# Patient Record
Sex: Male | Born: 1935
Health system: Southern US, Community
[De-identification: ages and names within clinical notes are randomized; demographics above are authoritative.]

## PROBLEM LIST (undated history)

## (undated) DIAGNOSIS — N189 Chronic kidney disease, unspecified: Secondary | ICD-10-CM

## (undated) DIAGNOSIS — M1990 Unspecified osteoarthritis, unspecified site: Secondary | ICD-10-CM

## (undated) DIAGNOSIS — N200 Calculus of kidney: Secondary | ICD-10-CM

## (undated) DIAGNOSIS — I10 Essential (primary) hypertension: Secondary | ICD-10-CM

## (undated) HISTORY — PX: JOINT REPLACEMENT: SHX530

## (undated) HISTORY — PX: TOTAL KNEE ARTHROPLASTY: SHX125

## (undated) HISTORY — PX: TONSILLECTOMY: SUR1361

## (undated) HISTORY — PX: TOTAL HIP ARTHROPLASTY: SHX124

## (undated) HISTORY — PX: ROTATOR CUFF REPAIR: SHX139

---

## 1997-10-30 ENCOUNTER — Encounter: Admission: RE | Admit: 1997-10-30 | Discharge: 1998-01-28 | Payer: Self-pay | Admitting: Orthopaedic Surgery

## 1999-08-09 ENCOUNTER — Encounter: Payer: Self-pay | Admitting: Orthopaedic Surgery

## 1999-08-12 ENCOUNTER — Inpatient Hospital Stay (HOSPITAL_COMMUNITY): Admission: RE | Admit: 1999-08-12 | Discharge: 1999-08-16 | Payer: Self-pay | Admitting: Orthopaedic Surgery

## 1999-08-30 ENCOUNTER — Encounter: Admission: RE | Admit: 1999-08-30 | Discharge: 1999-09-29 | Payer: Self-pay | Admitting: Orthopaedic Surgery

## 2001-04-03 ENCOUNTER — Encounter: Admission: RE | Admit: 2001-04-03 | Discharge: 2001-04-16 | Payer: Self-pay | Admitting: Orthopedic Surgery

## 2001-07-30 ENCOUNTER — Encounter: Payer: Self-pay | Admitting: Orthopaedic Surgery

## 2001-08-02 ENCOUNTER — Inpatient Hospital Stay (HOSPITAL_COMMUNITY): Admission: RE | Admit: 2001-08-02 | Discharge: 2001-08-06 | Payer: Self-pay | Admitting: Orthopaedic Surgery

## 2002-08-29 ENCOUNTER — Encounter: Payer: Self-pay | Admitting: Orthopedic Surgery

## 2002-08-29 ENCOUNTER — Inpatient Hospital Stay (HOSPITAL_COMMUNITY): Admission: EM | Admit: 2002-08-29 | Discharge: 2002-08-30 | Payer: Self-pay

## 2002-08-29 ENCOUNTER — Encounter: Payer: Self-pay | Admitting: Orthopaedic Surgery

## 2002-09-01 ENCOUNTER — Encounter: Payer: Self-pay | Admitting: Emergency Medicine

## 2002-09-01 ENCOUNTER — Inpatient Hospital Stay (HOSPITAL_COMMUNITY): Admission: EM | Admit: 2002-09-01 | Discharge: 2002-09-02 | Payer: Self-pay | Admitting: Emergency Medicine

## 2002-09-01 ENCOUNTER — Encounter: Payer: Self-pay | Admitting: Orthopedic Surgery

## 2003-12-31 ENCOUNTER — Observation Stay (HOSPITAL_COMMUNITY): Admission: EM | Admit: 2003-12-31 | Discharge: 2004-01-02 | Payer: Self-pay

## 2004-03-29 ENCOUNTER — Inpatient Hospital Stay (HOSPITAL_COMMUNITY): Admission: EM | Admit: 2004-03-29 | Discharge: 2004-03-30 | Payer: Self-pay | Admitting: Emergency Medicine

## 2004-04-23 ENCOUNTER — Emergency Department (HOSPITAL_COMMUNITY): Admission: EM | Admit: 2004-04-23 | Discharge: 2004-04-23 | Payer: Self-pay | Admitting: Emergency Medicine

## 2004-06-29 ENCOUNTER — Ambulatory Visit (HOSPITAL_COMMUNITY): Admission: RE | Admit: 2004-06-29 | Discharge: 2004-06-29 | Payer: Self-pay | Admitting: *Deleted

## 2008-10-01 ENCOUNTER — Encounter: Admission: RE | Admit: 2008-10-01 | Discharge: 2008-10-01 | Payer: Self-pay | Admitting: Internal Medicine

## 2009-08-05 ENCOUNTER — Encounter: Admission: RE | Admit: 2009-08-05 | Discharge: 2009-08-05 | Payer: Self-pay | Admitting: Internal Medicine

## 2010-07-19 ENCOUNTER — Emergency Department (HOSPITAL_COMMUNITY)
Admission: EM | Admit: 2010-07-19 | Discharge: 2010-07-19 | Payer: Self-pay | Source: Home / Self Care | Admitting: Emergency Medicine

## 2010-08-08 ENCOUNTER — Encounter
Admission: RE | Admit: 2010-08-08 | Discharge: 2010-08-08 | Payer: Self-pay | Source: Home / Self Care | Attending: Nephrology | Admitting: Nephrology

## 2010-08-22 ENCOUNTER — Encounter: Payer: Self-pay | Admitting: Internal Medicine

## 2010-09-01 ENCOUNTER — Encounter: Payer: Self-pay | Admitting: Nephrology

## 2010-10-11 LAB — POCT I-STAT, CHEM 8
BUN: 38 mg/dL — ABNORMAL HIGH (ref 6–23)
Calcium, Ion: 1.31 mmol/L (ref 1.12–1.32)
Chloride: 108 mEq/L (ref 96–112)
Creatinine, Ser: 1.5 mg/dL (ref 0.4–1.5)
Glucose, Bld: 112 mg/dL — ABNORMAL HIGH (ref 70–99)
HCT: 33 % — ABNORMAL LOW (ref 39.0–52.0)
Hemoglobin: 11.2 g/dL — ABNORMAL LOW (ref 13.0–17.0)
Potassium: 4.2 mEq/L (ref 3.5–5.1)
Sodium: 143 mEq/L (ref 135–145)
TCO2: 26 mmol/L (ref 0–100)

## 2010-10-11 LAB — CBC
HCT: 34.1 % — ABNORMAL LOW (ref 39.0–52.0)
Hemoglobin: 11.5 g/dL — ABNORMAL LOW (ref 13.0–17.0)
MCH: 32.3 pg (ref 26.0–34.0)
MCHC: 33.7 g/dL (ref 30.0–36.0)
MCV: 95.8 fL (ref 78.0–100.0)
Platelets: 182 10*3/uL (ref 150–400)
RBC: 3.56 MIL/uL — ABNORMAL LOW (ref 4.22–5.81)
RDW: 14.4 % (ref 11.5–15.5)
WBC: 7.9 10*3/uL (ref 4.0–10.5)

## 2010-10-11 LAB — DIFFERENTIAL
Basophils Absolute: 0 10*3/uL (ref 0.0–0.1)
Basophils Relative: 0 % (ref 0–1)
Eosinophils Absolute: 0.4 10*3/uL (ref 0.0–0.7)
Eosinophils Relative: 6 % — ABNORMAL HIGH (ref 0–5)
Lymphocytes Relative: 12 % (ref 12–46)
Lymphs Abs: 0.9 10*3/uL (ref 0.7–4.0)
Monocytes Absolute: 0.6 10*3/uL (ref 0.1–1.0)
Monocytes Relative: 8 % (ref 3–12)
Neutro Abs: 5.9 10*3/uL (ref 1.7–7.7)
Neutrophils Relative %: 75 % (ref 43–77)

## 2010-12-17 NOTE — Cardiovascular Report (Signed)
NAMEWADDELL, SCHLESSER NO.:  [id number]   MEDICAL RECORD NO.:  [id number]          PATIENT TYPE:  AMB   LOCATION:  ENDO                         FACILITY:  [facility]   PHYSICIAN:  Octavia Heir, MD  DATE OF BIRTH:  Feb 26, 1930   DATE OF PROCEDURE:  06/29/2004  DATE OF DISCHARGE:                              CARDIAC CATHETERIZATION   PROCEDURES PERFORMED:  Transesophageal echocardiogram.   ATTENDING:  Octavia Heir, M.D.   COMPLICATIONS:  None.   INDICATIONS:  Mr. Kehres is a 77 year old male patient of Dr. Tamera Punt,  Dr. Quay Burow recently noted to have a probable CVA.  He underwent 2-D  echocardiogram with a suggestion of a possible right atrial mass.  He is now  referred for transesophageal echocardiogram to rule out possibility of right  atrial mass.   DESCRIPTION OF OPERATION:  After giving informed written consent, patient  brought to the endoscopy suite in a fasting state.  The patient underwent  successful and uncomplicated transesophageal echocardiogram.   The left ventricle appeared to be mildly thickened with normal LV systolic  function, estimated EF approximately 50-55%.   Mildly thickened aortic valve leaflets with no evidence of significant  stenosis and trivial aortic regurgitation.   Mildly thickened mitral valve leaflets with mild prolapse of the posterior  mitral valve leaflet.  There is noted to be mild mitral regurgitation.   Structurally normal tricuspid valve, mild tricuspid regurgitation.   Normal pulmonic valve with mild pulmonic regurgitation.   No evidence of intracardiac mass or thrombus noted.  There does not appear  to be any extrinsic compression of the RA present.   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  This represents a very small  patent foramen ovale.   Normally ascending thoracic aorta.   CONCLUSION:  Successful  transesophageal echocardiogram with findings noted  above.  There is no evidence of intracardiac mass or extracardiac  compression visible.      Robe   RHM/MEDQ  D:  06/29/2004  T:  06/29/2004  Job:  [id number]   cc:   Quay Burow, M.D.  Fax: [phone]   Burnard Bunting, M.D.  [address]  [place]  [place] [address]  Fax: [phone]

## 2010-12-17 NOTE — Discharge Summary (Signed)
   NAME:  Nathaniel [name], Nathaniel [name]                         ACCOUNT NO.:  [id number]   MEDICAL RECORD NO.:  [id number]                   PATIENT TYPE:  INP   LOCATION:  5040                                 FACILITY:  [facility]   PHYSICIAN:  Lauretta Grill, M.D.                 DATE OF BIRTH:  04-26-1930   DATE OF ADMISSION:  09/01/2002  DATE OF DISCHARGE:  09/02/2002                                 DISCHARGE SUMMARY   ADMISSION DIAGNOSIS:  Dislocated left total hip arthroplasty.   DISCHARGE DIAGNOSIS:  Dislocated left total hip arthroplasty, status post  reduction.   HOSPITAL COURSE:  The patient was admitted through the emergency department  on September 01, 2002, with a dislocated left total hip after a fall. He had  apparently dislocated this on several occasions in the past. The plan was  made to simply closed reduce him at this time and he may need a revision at  some point _____ liner.   On September 01, 2002, he was taken to the operating room and placed under  general anesthesia and the hip was closed reduced and confirmed in position  by x-ray. There was no estimated blood loss. He was taken to the recovery  room in stable condition.   On postoperative day #1, September 02, 2002, he was doing well. We were  consulting physical therapy to start him in proper gait and bracing to avoid  repeat of dislocation. They evaluated him on hip precautions and pronounced  him ready for discharge with home health physical therapy and occupational  therapy followup. He was discharged at that time.   DISCHARGE MEDICATIONS:  He already had pain medications. He was instructed  to take them as tolerated.   DISCHARGE INSTRUCTIONS:  His activity was weight bearing as tolerated using  the walker or crutches. He was instructed to keep the knee immobilizer on at  all times. His diet was without restrictions. Wound care was not applicable.  Special instructions were to call  for any numbness or tingling distally,  worsening swelling or bruising, fever greater than 101, chills, worsening  cough or shortness of breath. He was instructed to follow up with Dr.  Rhona Raider in 2 weeks.   CONDITION ON DISCHARGE:  Stable.     Thomasenia Bottoms, P.A.-C.                       Lauretta Grill, M.D.    AC/MEDQ  D:  10/22/2002  T:  10/23/2002  Job:  [id number]

## 2010-12-17 NOTE — Op Note (Signed)
[place]. [facility]  Patient:    Nathaniel [name]                       MRN: [id number] Proc. Date: 08/12/99 Adm. Date:  [id number] Attending:  Melrose Nakayama                           Operative Report  PREOPERATIVE DIAGNOSIS:  Left knee degenerative arthritis.  POSTOPERATIVE DIAGNOSIS:  Left knee degenerative arthritis.  OPERATION:  Left total knee replacement.  SURGEON:  Monico Blitz. Rhona Raider, M.D.  ASSESSMENT:  Nathaniel [name], P.A.  ANESTHESIA:  Epidural and general.  INDICATIONS FOR PROCEDURE:  The patient is a 77 year old man with a many-year history of painful arthritis, both knees.  He is 2 years out from a successful right total knee replacement.  At this point elected to have his left knee replaced.  He had failed conservative measures of injections and anti-inflammatories.  He was given informed preoperative consent, discussing possible complications of reaction to anesthesia, infection, DVT, PE, and death.  DESCRIPTION OF PROCEDURE:  The patient was taken to the operating room where epidural anesthetic was applied.  He was positioned supine and prepped and draped in a normal sterile fashion.  After administration of _______ IV antibiotics, the left leg was elevated, exsanguinated, and a tourniquet inflated about the thigh. All appropriate anti-infective measures were used including the aforementioned V antibiotics, Betadine-impregnated drape, and closed-hooded exhaust systems for ach member of the surgical team.  A longitudinal incision was made over the anterior aspect of the knee, was suctioned down the extensor mechanism.  The patient seemed to feel this at least partially and as a result a general anesthetic was applied. We then performed a medial parapatellar arthrotomy and flipped thee kneecap and  flexed the knee.  He had no residual ACL and several large, bony loose bodies. He had end-stage degeneration of all three  compartments.  He had excellent bone quality.  There were some residual meniscal tissues which were removed.  Most of the fat pad was sacrificed.  A cut was made on the tibia with an extramedullary  guide with a 10 degree slope.  The femur sized to a large plus component and the cutting guide was applied.  Anterior and posterior cuts were made to create a flexion gap of 10 mm judged off the tibial cut.  The Chamfer guide was then applied and appropriate Chamfer cuts were made to fit a large plus femoral component. he tibia sized to a large plus-plus component and the appropriate guide was applied and used.  A 12.5 mm spacer was eventually found to be the best at balancing the knee in both flexion and extension.  This required a 2 mm extra cut on the superior aspect of the tibia.  Attention was turned to the patella which was flipped.  About 10 mm of bone was  removed with an appropriate guide.  The drill holes were then made with another  guide.  All trial components were removed, followed by thorough irrigation with  pulsatile lavage.  Cement was mixed including antibiotic.  This was then pressurized, sent to all three bony surfaces, followed by placement of the large plus-plus tibial component, large plus femoral component, and large plus patellar component, all from the LCS Depuy system.  A 12.5 mm deep dish spacer was placed. the knee was stable in full extension to varus and  valgus stress and flexed past 90 without difficulty.  The patella tracked well and no lateral release was required. Pressure was held on the components until the cement had hardened and then the tourniquet was released.  A small amount of bleeding was easily controlled with  Bovie cautery.  The wound was again irrigated followed by reapproximation of the extensor mechanism with interrupted sutures of #1 Vicryl.  Once that had been completed, the knee again flexed past 90 and the repair was  stable. Subcutaneous tissues reapproximated in two layers in 2-0 undyed Vicryl in interrupted fashion, followed by skin closure with staples.  Adaptic was placed on the wound followed by dry gauze and a loose ACE wrap.  Estimated blood loss and intraoperative fluids can be obtained from anesthesia records, as again accurate tourniquet time which was less than an hour.  DISPOSITION:  The patient was extubated in the operating room and taken to the recovery room in stable condition.  PLAN:  Plans were for him to be admitted to the orthopedic surgery service for appropriate postoperative care to include perioperative antibiotics, epidural if possible for postoperative pain control, and Coumadin for DVT prophylaxis which was actually started last night. DD:  08/12/99 TD:  08/12/99 Job: [id number]

## 2010-12-17 NOTE — Op Note (Signed)
[place]. [facility]  Patient:    Nathaniel [name], Nathaniel [name] Visit Number: [id number] MRN: [id number]          Service Type: SUR Location: [place] Attending Physician:  Melrose Nakayama Dictated by:   Monico Blitz. Rhona Raider, M.D. Proc. Date: 08/02/01 Admit Date:  08/02/2001                             Operative Report  PREOPERATIVE DIAGNOSIS:  Left hip degenerative arthritis.  POSTOPERATIVE DIAGNOSIS:  Left hip degenerative arthritis.  OPERATION PERFORMED:  Left hip replacement total.  ANESTHESIA:  General.  ATTENDING SURGEON:  Monico Blitz. Rhona Raider, M.D.  ASSISTANT:  Roselee Nova, P.A.  INDICATIONS FOR PROCEDURE:  The patient is a 77 year old male with a long history of degenerative arthritis of the left hip.  He has persisted with difficulty in activity restriction due to his arthritis.  This has been refractory to oral anti-inflammatory and activity restriction.  He was offered a hip replacement operation.  The procedure was discussed with the patient and informed operative consent was obtained after discussion of possible complications of reaction to anesthesia, infection, deep vein thrombosis, pulmonary embolus, dislocation, and death.  DESCRIPTION OF PROCEDURE:  The patient was taken to an operating suite where general anesthetic was applied without difficulty.  He was then positioned in lateral decubitus position with the left hip up.  Hip positioners were used and all bony prominences were appropriately padded including the placement of axillary roll.  The left hip was then prepped and draped in normal sterile fashion.  After the administration of preop intravenous antibiotics the posterior approach was taken to the left hip.  All appropriate anti-infective measures were used including Betadine impregnated drape, preoperative intravenous antibiotics, and closed hooded exhaust systems for each member of the surgical team.  He had a very thick adipose  layer and eventually the fascia lata and gluteus maximus were exposed.  These were incised longitudinally to expose the short external rotators of the hip which were tagged and reflected.  The posterior capsule was excised.  The hip was dislocated.  A femoral neck cut was made just above the lesser trochanter and a severely degenerative femoral head was removed.  The acetabulum was exposed and the labrum was excised.  He had good bone quality.  This was reamed up to size 57 and a 58 mm Pinnacle Depuy porous coated cup was placed.  A single apical hole was placed followed by placement of a 10 degree 32 mm liner. Attention was then turned toward the femur.  This was reamed up to about 12 or 13 mm followed by sequential broaching up to size 4.  A trial reduction was done with +1 and +5 neck, high offset.  These both seemed to fit well but the +5 was slightly more stable.  Trial components were removed followed by irrigation of the canal.  A distal cement spacer was placed followed by pressurization of cement in the canal including antibiotic.  The #4 high offset Luster Depuy stem was then placed in appropriate anteversion.  Pressure was held on the component until the cement had hardened and all excess was trimmed.  The +5 ball 32 mm was then placed on a dry stem and the hip was again reduced.  The hip was very stable in extension with external rotation and flexion with internal rotation.  Leg lengths were felt to be equal.  The wound was  thoroughly irrigated followed by reapproximation of short external rotators to the greater trochanter with nonabsorbable suture.  The fascial layer was then closed with interrupted #1 Vicryl followed by subcutaneous reapproximation in two layers and skin closure with staples.  Adaptic was applied to the wound followed by dry gauze and tape.  Estimated blood loss and intraoperative fluids can be obtained from Anesthesia records.  DISPOSITION:  The patient was  extubated in the operating room and taken to the recovery room in stable condition.  Plans were for him to be admitted for appropriate postoperative care to include perioperative antibiotics and Coumadin for deep vein thrombosis prophylaxis. Dictated by:   Monico Blitz Rhona Raider, M.D. Attending Physician:  Melrose Nakayama DD:  08/02/01 TD:  08/02/01 Job: [id number]

## 2010-12-17 NOTE — Discharge Summary (Signed)
[place]. [facility]  Patient:    Nathaniel [name]                       MRN: [id number] Adm. Date:  [id number] Disc. Date: [id number] Attending:  Melrose Nakayama Dictator:   [name], P.A.                           Discharge Summary  ADMISSION DIAGNOSES: 1. End-stage bone-on-bone degenerative joint disease, left knee. 2. Hypertension. 3. History of arthritis.  DISCHARGE DIAGNOSES: 1. End-stage bone-on-bone degenerative joint disease, left knee. 2. Hypertension. 3. History of arthritis. 4. Urine retention.  HISTORY OF PRESENT ILLNESS:  This 77 year old white male who is a patient well known to [place], as we had replaced his right knee some time ago for severe arthritis, and now the left knee has followed suit.  He is having pain with every step, trouble sleeping at night time.  He is on Vioxx which is not helping, and has had some cortisone injections which were short-lived.  Discussed treatment options, and he has decided to proceed with a total knee replacement, as we had done on the opposite side.  LABORATORY/X-RAY DATA:  Normal sinus rhythm on electrocardiogram.  Last testing of his hemoglobin was 10.3, hematocrit 28.7.  Pro times done serially, as he was on low-dose Coumadin protocol per the pharmacy for deep vein thrombosis prophylaxis.  HOSPITAL COURSE:  The patient was admitted postoperatively and placed on a variety of p.o. and IM analgesics for pain, low-dose Coumadin protocol per the pharmacy. He had physical therapy, to be weightbearing as tolerated.  The dressing was changed the second day postoperatively and the drain was pulled.  The wound was  noted to be benign.  He was eating.  He had some difficulty voiding.  Dr. Rana Snare was consulted, a urologist, and it was felt that he had suffered some urologic trauma from the Foley catheter.  DISPOSITION:  He progressed well and is discharged home.  CONDITION ON DISCHARGE:   Improved.  INSTRUCTIONS:  He will be on Coumadin for four weeks, regulated by the pharmacy. Home physical therapy was ordered.  He is given a prescription for pain medicine. He will be weightbearing as tolerated with crutches or a walker.  FOLLOWUP:  He will return to our office within one week for stitch or staple removal. DD:  09/07/99 TD:  09/07/99 Job: [id number]

## 2010-12-17 NOTE — Consult Note (Signed)
[place]. [facility]  Patient:    Nathaniel [name]                       MRN: [id number] Proc. Date: 08/14/99 Adm. Date:  [id number] Attending:  Melrose Nakayama Dictator:   Rana Snare, M.D. CC:         Monico Blitz. Rhona Raider, M.D.                          Consultation Report  REASON FOR CONSULTATION:  Gross hematuria and questionable voiding difficulties.  HISTORY OF PRESENT ILLNESS:  Nathaniel [name] is a 77 year old white male.  He apparently has had one kidney stone in the past, but otherwise does not have any significant urologic history.  He reports no major problems with his voiding status prior to his recent admission to [facility].  He reports no significant problems with  nocturia or frequency and has generally had a good urinary stream without gross  hematuria.  The patient was admitted for postop care, status post a total knee replacement on August 12, 1999.  The patient was started on anticoagulation for DVT prophylaxis.  A Foley catheter was left indwelling.  Apparently the patient did climb out of bed the other night, and there was some concern that he may have traumatized himself with the Foley catheter as some gross hematuria was noted. The patient also began complaining of significant amounts of urgency and dome Ditropan was ordered.  It is uncertain whether the Foley was draining well last night. t was removed this morning and apparently upon the removal there was a gush of some grossly bloody urine and also some clot per urethra.  I was contacted by the PA for the service to see if we would please evaluate this gentleman.  Since that time, he has been able to void three times with a total volume only about 50 to 75 cc and is grossly bloody with a small amount of oozing from his penis.  PAST MEDICAL HISTORY:  Otherwise unremarkable for any other significant urologic history.  He has had a right knee replaced and rotator cuff  surgery.  MEDICATIONS:  He has been on some anti-inflammatories.  ALLERGIES:  He has no drug allergies.  HABITS:  He is not a smoker or drinker.  REVIEW OF SYSTEMS:  His review of systems again was unremarkable for any gross hematuria, dysuria, or major voiding symptoms preoperatively.  PHYSICAL EXAMINATION:  GENERAL:  He is a well-developed, well-nourished male.  EXTREMITIES:  His left leg is immobilized.  ABDOMEN:  Soft.  There is a question of some bladder distension.  GU:  There is a small amount of blood oozing from the urethral meatus which is  otherwise normal, and the testes, scrotum, and adnexal structures are unremarkable. I did not perform prostate examination at this time.  PROCEDURE:  We elected to go ahead and place an 86 French Foley catheter. Approximately 650 cc was obtained and the urine was a very light pink in color.  Irrigation revealed no clots.  ASSESSMENT:  History of gross hematuria with subsequent urinary retention.  Is suspect the patient did indeed suffer some prostatic trauma from dislodging of he prostate balloon into his prostatic fossa.  This probably resulted in some tears which led to the bleeding and now the retention.  I think he needs to have an indwelling Foley for 24-48 hours and initiation of some Flomax.  We will try a voiding trial in 48 hours to see how he does.  We will plan additional evaluation postoperatively to be sure there is no other cause for his hematuria. DD:  08/14/99 TD:  08/14/99 Job: [id number]

## 2010-12-17 NOTE — Op Note (Signed)
NAMEEOGHAN, SALMO                         ACCOUNT NO.:  [id number]   MEDICAL RECORD NO.:  [id number]                   PATIENT TYPE:  INP   LOCATION:  0460                                 FACILITY:  [facility]   PHYSICIAN:  Monico Blitz. Rhona Raider, M.D.             DATE OF BIRTH:  1929/11/02   DATE OF PROCEDURE:  03/29/2004  DATE OF DISCHARGE:                                 OPERATIVE REPORT   PREOPERATIVE DIAGNOSES:  Dislocated left total hip replacement.   POSTOPERATIVE DIAGNOSES:  Dislocated left total hip replacement.   PROCEDURE:  Closed reduction left dislocated hip replacement.   ANESTHESIA:  General mask.   SURGEON:  Monico Blitz. Rhona Raider, M.D.   INDICATIONS FOR PROCEDURE:  The patient is a 77 year old  man several years  out from a left hip replacement. He is also about a year and a half out from  a dislocation which required a closed reduction.  Unfortunately today, he  was bending over to pick up a bolt and his hip slipped out of place. He was  seen in the emergency room and orthopedics was consulted about this problem.  He was offered a repeat closed reduction.  Informed operative consent was  obtained after a discussion of the possible complications of reaction to  anesthesia and repeat dislocation.   DESCRIPTION OF PROCEDURE:  The patient was taken to the operating suite  where general anesthetic was applied by mask and IV. He was positioned  supine. With flexion and traction, his hip reduced in a moderately difficult  fashion.  His leg lengths were subsequently equal and he had normal rotation  of the leg.  Flexing his hip up to 100 degrees, the hip did not dislocate  again.  Estimated blood loss is none. Intraoperative fluids can be obtained  from anesthesia records.   DISPOSITION:  The patient was taken to the recovery room stable. Plans for  him to be admitted to the orthopedic surgery service for appropriate postop  care to include physical therapy and bracing.  There  is the chance he may  require revision surgery though his components seem to be in excellent  position.                                               Monico Blitz Rhona Raider, M.D.    PGD/MEDQ  D:  03/29/2004  T:  03/30/2004  Job:  [id number]

## 2010-12-17 NOTE — Discharge Summary (Signed)
   Nathaniel [name], [name]                         ACCOUNT NO.:  [id number]   MEDICAL RECORD NO.:  [id number]                   PATIENT TYPE:  INP   LOCATION:  5016                                 FACILITY:  [facility]   PHYSICIAN:  Monico Blitz. Rhona Raider, M.D.             DATE OF BIRTH:  07/29/1930   DATE OF ADMISSION:  08/29/2002  DATE OF DISCHARGE:  08/30/2002                                 DISCHARGE SUMMARY   ADMITTING DIAGNOSIS:  Left hip dislocation, status post total hip  replacement.   DISCHARGE DIAGNOSIS:  Left hip dislocation, status post total hip  replacement.   OPERATION:  Closed reduction of left hip replacement.   HISTORY OF PRESENT ILLNESS:  The patient is a 77 year old white male who is  a patient well-known to our practice.  We had replaced his hip on the left  side some time ago and he had done well until he had fallen on the ice,  hitting on his left side, and then with significant pain in his left hip,  was unable to walk and was transported by EMS to the hospital and localizing  x-rays were taken and it was noted to be dislocated.   PERTINENT LABORATORY AND X-RAY FINDINGS:  X-rays showed the superior  dislocation of the femoral component of the left hip.   Hemoglobin 14.2, hematocrit 40.5, otherwise indices essentially normal.   COURSE IN THE HOSPITAL:  The patient was taken to the operating room and his  hip was replaced under general anesthesia and he was kept on the floor for  one day and was discharged home the next morning in good condition.   CONDITION ON DISCHARGE:  Improved.   FOLLOWUP:  He will stay in the knee immobilizer, will continue on pain  medications p.r.n. and return to the office in about one to two weeks for a  recheck and we will caution him about total hip precautions and cautions for  dislocation as he should try to prevent.     Roselee Nova, P.A.                    Monico Blitz Rhona Raider, M.D.    MC/MEDQ  D:  10/01/2002  T:  10/01/2002   Job:  [id number]

## 2010-12-17 NOTE — Op Note (Signed)
   NAME:  Nathaniel [name], Nathaniel [name]                         ACCOUNT NO.:  [id number]   MEDICAL RECORD NO.:  [id number]                   PATIENT TYPE:  EMS   LOCATION:  MINO                                 FACILITY:  [facility]   PHYSICIAN:  Monico Blitz. Rhona Raider, M.D.             DATE OF BIRTH:  [birth year]   DATE OF PROCEDURE:  08/29/2002  DATE OF DISCHARGE:                                 OPERATIVE REPORT   PREOPERATIVE DIAGNOSIS:  Left total hip replacement dislocation.   POSTOPERATIVE DIAGNOSIS:  Left total hip replacement dislocation.   PROCEDURE:  Left total hip replacement relocation.   SURGEON:  Monico Blitz. Rhona Raider, M.D.   ANESTHESIA:  General.   ASSISTANT:  Roselee Nova, P.A.   INDICATIONS FOR PROCEDURE:  The patient is a 77 year old man 10 years out  from a left hip replacement.  He has done very well, but unfortunately fell  on the ice today and twisted his leg in an unfavorable position.  He  suffered his first dislocation at this point.  He was seen in the emergency  room and attempt at a closed reduction was made, but was unsuccessful. He  was scheduled for this procedure under general anesthesia.  The procedure  had been discussed with the patient and informed operative consent was  obtained after discussion of possible complications of reaction to  anesthesia, infection, and loosening of components.   DESCRIPTION OF PROCEDURE:  The patient was taken to the operating room where  general anesthesia was applied without difficulty.  He was positioned supine  with his hip flexed 90 degrees, some traction was applied gently, and with  slight rotation, the hip popped into place.  Fluoroscopy was used to confirm  that the hip was reduced.  I read these views myself.  He was then gradually  awakened from his anesthetic.  Intraoperative fluids can be obtained from  the anesthesia records. He was taken to the recovery room in stable  condition with plans to stay overnight with probably  discharge home in the  morning once he can mobilize with physical therapy.                                               Monico Blitz Rhona Raider, M.D.    PGD/MEDQ  D:  08/29/2002  T:  08/29/2002  Job:  [id number]

## 2010-12-17 NOTE — Op Note (Signed)
   NAME:  Nathaniel [name], Nathaniel [name]                         ACCOUNT NO.:  [id number]   MEDICAL RECORD NO.:  [id number]                   PATIENT TYPE:  INP   LOCATION:  5040                                 FACILITY:  [facility]   PHYSICIAN:  Lauretta Grill, M.D.                 DATE OF BIRTH:  November 04, 1929   DATE OF PROCEDURE:  09/01/2002  DATE OF DISCHARGE:                                 OPERATIVE REPORT   PREOPERATIVE DIAGNOSIS:  Closed left total hip arthroplasty dislocation.   POSTOPERATIVE DIAGNOSIS:  Closed left total hip arthroplasty dislocation.   PROCEDURE:  Closed reduction of left total hip arthroplasty.   SURGEON:  Lynden Ang, M.D.   ASSISTANT:  Thomasenia Bottoms, P.A.-C.   ANESTHESIA:  General endotracheal.   CULTURES DONE:  None.   ESTIMATED BLOOD LOSS:  None.   PATHOLOGIC FINDINGS/HISTORY:  The patient is a 77 year old male treated by  Dr. Rhona Raider with a total hip arthroplasty DePuy uncemented type  approximately a year ago.  He slipped on the ice 01/29, dislocated the hip.  This was two days ago.  He had it reduced and was sent home.  Last evening  he does not remember what happened but it popped out again.  On exam today  it was shortened and externally rotated.  Hip x-rays showed the hip femoral  head to be superior.  We were able to reduce it with fairly strong pressure  under muscle relaxation but it did dislocate with internal rotation flexion  at about 90 degrees and 30 degrees.  I felt it was somewhat unstable.  We  put him in a knee immobilizer and abduction pillow.  He may come  ________.   PROCEDURE:  With adequate anesthesia obtained using endotracheal technique  the patient was placed in the supine position.  The patient had muscle  relaxation on the [place] table and the C-arm was brought in.  I then  reduced the dislocation.  At first it was perched and then I was able to get  it back in.  I then tested to get it back out and put it back in again by  adduction,  external rotation and flexion and traction.  He was placed in a  knee immobilizer and wedge type pillow and taken to the recovery room to be  admitted for routine postoperative care and evaluation by Dr. Rhona Raider  tomorrow.                                               Lauretta Grill, M.D.    VEP/MEDQ  D:  09/01/2002  T:  09/01/2002  Job:  [id number]

## 2010-12-17 NOTE — Discharge Summary (Signed)
NAME:  Nathaniel [name], Nathaniel [name]                         ACCOUNT NO.:  [id number]   MEDICAL RECORD NO.:  [id number]                   PATIENT TYPE:  OBV   LOCATION:  5730                                 FACILITY:  [facility]   PHYSICIAN:  Judeth Horn, M.D.                   DATE OF BIRTH:  08/16/1929   DATE OF ADMISSION:  DATE OF DISCHARGE:  01/02/2004                                 DISCHARGE SUMMARY   FINAL DIAGNOSES:  1. Crush injury.  2. Blunt chest wall trauma.  3. Chest wall contusion.  4. Old cerebrovascular accidents.  5. Hypertension.   HISTORY:  This is a 77 year old white male who was standing by his tractor  when he put it in gear.  It rolled forward and crushed him and gave his some  contusion to his chest wall.  He was brought to [facility]  emergency room.  Workup was performed.  No definite rib fractures were ever  seen.  He had no pneumothorax.  CT of the head and neck were negative except  for signs of arthritis.   HOSPITAL COURSE:  The patient was admitted for observation.  The patient did  well during his stay in the hospital.  Diet was advanced as tolerated.  He  was up and ambulating without difficulty.  By June 3, he was ready for  dischage.  At this point, he was doing well.  He was taking very little pain  medicines.  I have given him Vicodin one to two p.o. q.4-6h. p.r.n. for  pain, #30 of these, no refills. He was told to follow up with trauma clinic  on the 14th of June.   DISCHARGE CONDITION:  He is subsequently discharged at this time in  satisfactory and stable condition.      Billey Chang, P.A.                      Judeth Horn, M.D.    CL/MEDQ  D:  01/02/2004  T:  01/04/2004  Job:  [id number]   cc:   Judeth Horn III, M.D.  [address] [place]  [place] [address]  Fax: [phone]

## 2010-12-17 NOTE — Discharge Summary (Signed)
NAMEEDO, GRZYB                         ACCOUNT NO.:  [id number]   MEDICAL RECORD NO.:  [id number]                   PATIENT TYPE:  INP   LOCATION:  0460                                 FACILITY:  [facility]   PHYSICIAN:  Monico Blitz. Rhona Raider, M.D.             DATE OF BIRTH:  [birth year]   DATE OF ADMISSION:  03/29/2004  DATE OF DISCHARGE:  03/30/2004                                 DISCHARGE SUMMARY   ADMISSION DIAGNOSES:  1.  Left total hip replacement with dislocated hip.  2.  Hypertension.  3.  Status post bilateral total knee replacements.  4.  Status post rotator cuff repair.   DISCHARGE DIAGNOSES:  1.  Left total hip replacement with dislocated hip.  2.  Hypertension.  3.  Status post bilateral total knee replacements.  4.  Status post rotator cuff repair.   OPERATIONS:  Closed reduction in the operating room of left hip dislocation.   HISTORY:  Nathaniel [name] is a patient well known to our practice.  We have known  him for many years and done multiple joint replacements on him.  He had his  left hip replaced 1-1/2 to 2 years ago and had minimal difficulties until  yesterday when he was working, and he works as a Systems developer, was bending  over in an awkward position and his hip dislocated.  He was unable to bear  weight, was transported to [facility] Emergency Room, and a x-ray there had  a status post total hip replacement dislocation.  We had gone over the  treatment options with him, basically were either to try to relocate the hip  in the emergency room which we elected not to do versus going to the  operating room for just a quick general anesthesia and hip relocation which  is what was done.   LABORATORY DATA:  No labs were drawn.  He had a recent EKG, chest x-ray, and  lab work done at [facility] and these were faxed over to the  anesthesiologist.  His last studies were done on March 12, 2004.  White  blood cell count was 5.4, hemoglobin 14.1, hematocrit  40.9.  EKG normal  sinus rhythm.   HOSPITAL COURSE:  The patient was admitted from the emergency room and later  that afternoon taken to the operating room where general anesthesia was  given and his hip was relocated without difficulty.  On the first day  postoperatively his vital signs were stable, blood pressure 115/60,  temperature 97.  He had good neurovascular status to his left lower  extremity and right lower extremity.  Lungs were clear.  Cardiac:  S1 and  S2.  Abdomen was soft, positive bowel sounds.  Physical therapy was to get  him up this day and teach him just the weightbearing gait with assistance or  without.  Also go over, as I have gone over with him today, total hip  dislocation precautions.  His IV and O2 were discontinued.  He was  discharged home.   CONDITION ON DISCHARGE:  Improved.   FOLLOWUP:  He is on two blood pressure medications which he did not remember  the name of prior to his admission to the hospital, and from his [facility] records I do not see them listed in there as well, but he  will resume his two medications which he has at home and has refills on he  states.  I also wrote him a prescription for Vicodin one or two q.4-6h.  p.r.n. pain.  He can be weightbearing as tolerated with crutches or walker  or a cane.  He has gone over with me and with the physical therapist his hip  dislocation precautions.  We will see him back in our office in two weeks.  Call [phone] for appointment.   DIET:  Regular.   WOUND CARE:  No wound or dressing changes.     Roselee Nova, P.A.                    Monico Blitz Rhona Raider, M.D.    MC/MEDQ  D:  03/30/2004  T:  03/30/2004  Job:  [id number]

## 2010-12-17 NOTE — Discharge Summary (Signed)
[place]  Patient:    Nathaniel [name], Nathaniel [name] Visit Number: [id number] MRN: [id number]          Service Type: SUR Location: [facility] Attending Physician:  Melrose Nakayama Dictated by:   [name], P.A. Admit Date:  08/02/2001 Discharge Date: 08/06/2001                             Discharge Summary  ADMISSION DIAGNOSES: 1. End-stage left hip degenerative joint disease. 2. Hypertension.  DISCHARGE DIAGNOSES: 1. End-stage left hip degenerative joint disease. 2. Hypertension.  OPERATIONS:  Left total hip replacement.  BRIEF HISTORY:  A 77 year old white male patient well known to [place].  We had replaced his knee back in 1998 and is having pain with every step now in his left hip and some trouble sleeping at night time.  X-rays revealed end-stage bone on bone DJD and we have discussed treatment options with the patient as being total hip replacement.  PERTINENT X-RAY AND LABORATORY DATA:  Last hemoglobin 11.8, hematocrit 33.5. Serial prothrombin times were drawn as he was on low dose Coumadin protocol. Other lab indices can be found per medical records.  Chest x-ray showed some atelectasis and some scarring but no active disease noted.  HOSPITAL COURSE:  The patient was admitted postoperatively and placed on a variety of p.o. and IM analgesics for pain. IV Ancef one gram q.8h. x3 doses. Dressings were changed numerous times throughout his hospital stay.  Physical therapy was ordered for weight bearing as tolerated.  The pharmacy was also ordered for help with low dose anticoagulation Coumadin therapy and he was discharged home.  CONDITION ON DISCHARGE:  Improved.  FOLLOW-UP:  He will return to our office in about seven to ten days.  He was given a prescription for Coumadin and one for Percocet for pain.  Arrangements for home physical therapy and home prothrombin times were also arranged. Dictated by:   [name],  P.A. Attending Physician:  Melrose Nakayama DD:  09/24/01 TD:  09/24/01 Job: [id number]

## 2011-08-10 DIAGNOSIS — M545 Low back pain, unspecified: Secondary | ICD-10-CM | POA: Diagnosis not present

## 2011-08-10 DIAGNOSIS — M47817 Spondylosis without myelopathy or radiculopathy, lumbosacral region: Secondary | ICD-10-CM | POA: Diagnosis not present

## 2011-08-18 DIAGNOSIS — N390 Urinary tract infection, site not specified: Secondary | ICD-10-CM | POA: Diagnosis not present

## 2011-08-18 DIAGNOSIS — N2581 Secondary hyperparathyroidism of renal origin: Secondary | ICD-10-CM | POA: Diagnosis not present

## 2011-08-18 DIAGNOSIS — N184 Chronic kidney disease, stage 4 (severe): Secondary | ICD-10-CM | POA: Diagnosis not present

## 2011-08-18 DIAGNOSIS — I129 Hypertensive chronic kidney disease with stage 1 through stage 4 chronic kidney disease, or unspecified chronic kidney disease: Secondary | ICD-10-CM | POA: Diagnosis not present

## 2011-08-23 DIAGNOSIS — M47817 Spondylosis without myelopathy or radiculopathy, lumbosacral region: Secondary | ICD-10-CM | POA: Diagnosis not present

## 2011-09-12 DIAGNOSIS — M545 Low back pain, unspecified: Secondary | ICD-10-CM | POA: Diagnosis not present

## 2011-09-16 DIAGNOSIS — N184 Chronic kidney disease, stage 4 (severe): Secondary | ICD-10-CM | POA: Diagnosis not present

## 2011-09-16 DIAGNOSIS — E213 Hyperparathyroidism, unspecified: Secondary | ICD-10-CM | POA: Diagnosis not present

## 2011-09-20 DIAGNOSIS — M545 Low back pain, unspecified: Secondary | ICD-10-CM | POA: Diagnosis not present

## 2011-09-21 DIAGNOSIS — M545 Low back pain, unspecified: Secondary | ICD-10-CM | POA: Diagnosis not present

## 2011-09-27 DIAGNOSIS — M545 Low back pain, unspecified: Secondary | ICD-10-CM | POA: Diagnosis not present

## 2011-09-27 DIAGNOSIS — M47817 Spondylosis without myelopathy or radiculopathy, lumbosacral region: Secondary | ICD-10-CM | POA: Diagnosis not present

## 2011-09-29 DIAGNOSIS — M545 Low back pain, unspecified: Secondary | ICD-10-CM | POA: Diagnosis not present

## 2011-10-04 DIAGNOSIS — M545 Low back pain, unspecified: Secondary | ICD-10-CM | POA: Diagnosis not present

## 2011-10-04 DIAGNOSIS — M47817 Spondylosis without myelopathy or radiculopathy, lumbosacral region: Secondary | ICD-10-CM | POA: Diagnosis not present

## 2011-10-06 DIAGNOSIS — M545 Low back pain, unspecified: Secondary | ICD-10-CM | POA: Diagnosis not present

## 2011-10-06 DIAGNOSIS — M47817 Spondylosis without myelopathy or radiculopathy, lumbosacral region: Secondary | ICD-10-CM | POA: Diagnosis not present

## 2011-10-10 DIAGNOSIS — M19019 Primary osteoarthritis, unspecified shoulder: Secondary | ICD-10-CM | POA: Diagnosis not present

## 2011-10-10 DIAGNOSIS — M47817 Spondylosis without myelopathy or radiculopathy, lumbosacral region: Secondary | ICD-10-CM | POA: Diagnosis not present

## 2011-10-25 DIAGNOSIS — M47817 Spondylosis without myelopathy or radiculopathy, lumbosacral region: Secondary | ICD-10-CM | POA: Diagnosis not present

## 2011-11-03 DIAGNOSIS — M47817 Spondylosis without myelopathy or radiculopathy, lumbosacral region: Secondary | ICD-10-CM | POA: Diagnosis not present

## 2011-11-09 DIAGNOSIS — M545 Low back pain, unspecified: Secondary | ICD-10-CM | POA: Diagnosis not present

## 2011-11-09 DIAGNOSIS — M25519 Pain in unspecified shoulder: Secondary | ICD-10-CM | POA: Diagnosis not present

## 2011-11-10 DIAGNOSIS — I10 Essential (primary) hypertension: Secondary | ICD-10-CM | POA: Diagnosis not present

## 2011-11-10 DIAGNOSIS — N184 Chronic kidney disease, stage 4 (severe): Secondary | ICD-10-CM | POA: Diagnosis not present

## 2011-11-10 DIAGNOSIS — D649 Anemia, unspecified: Secondary | ICD-10-CM | POA: Diagnosis not present

## 2011-11-10 DIAGNOSIS — E213 Hyperparathyroidism, unspecified: Secondary | ICD-10-CM | POA: Diagnosis not present

## 2011-11-11 DIAGNOSIS — M47817 Spondylosis without myelopathy or radiculopathy, lumbosacral region: Secondary | ICD-10-CM | POA: Diagnosis not present

## 2011-11-25 DIAGNOSIS — N184 Chronic kidney disease, stage 4 (severe): Secondary | ICD-10-CM | POA: Diagnosis not present

## 2011-11-29 DIAGNOSIS — M47817 Spondylosis without myelopathy or radiculopathy, lumbosacral region: Secondary | ICD-10-CM | POA: Diagnosis not present

## 2011-12-18 ENCOUNTER — Emergency Department (HOSPITAL_COMMUNITY)
Admission: EM | Admit: 2011-12-18 | Discharge: 2011-12-18 | Disposition: A | Discharge status: Home / Self Care | Payer: Medicare Other | Attending: Emergency Medicine | Admitting: Emergency Medicine

## 2011-12-18 ENCOUNTER — Encounter (HOSPITAL_COMMUNITY): Payer: Self-pay | Admitting: Nurse Practitioner

## 2011-12-18 ENCOUNTER — Emergency Department (HOSPITAL_COMMUNITY): Payer: Medicare Other

## 2011-12-18 DIAGNOSIS — T84029A Dislocation of unspecified internal joint prosthesis, initial encounter: Secondary | ICD-10-CM | POA: Diagnosis not present

## 2011-12-18 DIAGNOSIS — T148XXA Other injury of unspecified body region, initial encounter: Secondary | ICD-10-CM | POA: Diagnosis not present

## 2011-12-18 DIAGNOSIS — M25559 Pain in unspecified hip: Secondary | ICD-10-CM | POA: Diagnosis not present

## 2011-12-18 DIAGNOSIS — Z96649 Presence of unspecified artificial hip joint: Secondary | ICD-10-CM | POA: Insufficient documentation

## 2011-12-18 DIAGNOSIS — M169 Osteoarthritis of hip, unspecified: Secondary | ICD-10-CM | POA: Diagnosis not present

## 2011-12-18 DIAGNOSIS — Z0389 Encounter for observation for other suspected diseases and conditions ruled out: Secondary | ICD-10-CM | POA: Diagnosis not present

## 2011-12-18 DIAGNOSIS — S73006A Unspecified dislocation of unspecified hip, initial encounter: Secondary | ICD-10-CM | POA: Diagnosis not present

## 2011-12-18 DIAGNOSIS — S79929A Unspecified injury of unspecified thigh, initial encounter: Secondary | ICD-10-CM | POA: Insufficient documentation

## 2011-12-18 DIAGNOSIS — X500XXA Overexertion from strenuous movement or load, initial encounter: Secondary | ICD-10-CM | POA: Insufficient documentation

## 2011-12-18 DIAGNOSIS — S79919A Unspecified injury of unspecified hip, initial encounter: Secondary | ICD-10-CM | POA: Diagnosis not present

## 2011-12-18 MED ORDER — HYDROMORPHONE HCL PF 1 MG/ML IJ SOLN
INTRAMUSCULAR | Status: AC
  Filled 2011-12-18: qty 1

## 2011-12-18 MED ORDER — SODIUM CHLORIDE 0.9 % IV BOLUS (SEPSIS)
1000.0000 mL | Freq: Once | INTRAVENOUS | Status: AC
  Administered 2011-12-18: 1000 mL via INTRAVENOUS

## 2011-12-18 MED ORDER — PROPOFOL 10 MG/ML IV BOLUS: 200.0000 mg | Freq: Once | INTRAVENOUS | Status: DC

## 2011-12-18 MED ORDER — PROPOFOL 10 MG/ML IV BOLUS
INTRAVENOUS | Status: AC | PRN
  Administered 2011-12-18: 50 mg via INTRAVENOUS
  Administered 2011-12-18: 100 mg via INTRAVENOUS

## 2011-12-18 MED ORDER — SODIUM CHLORIDE 0.9 % IV SOLN
INTRAVENOUS | Status: AC | PRN
  Administered 2011-12-18: 1000 mL via INTRAVENOUS

## 2011-12-18 MED ORDER — HYDROMORPHONE HCL PF 1 MG/ML IJ SOLN
1.0000 mg | Freq: Once | INTRAMUSCULAR | Status: AC
  Administered 2011-12-18: 1 mg via INTRAVENOUS

## 2011-12-18 NOTE — ED Provider Notes (Signed)
Patient felt pop in his left hip when he was in the seated position and bent over,, flexing at the waist Treated by EMS with fentanyl prior to coming here with partial pain relief Exam alert nontoxic Glasgow Coma Score 15 left lower terminates is internally rotated and shortened DP pulse 2+ Reduction of left hip joint was performed by Dr. Marcello Moores under my direct supervision while I perform procedural sedation with propofol. Patient's last ate at 3 PM today Procedural sedation Performed by: Orlie Dakin Consent: Verbal consent obtained. Risks and benefits: risks, benefits and alternatives were discussed Required items: required blood products, implants, devices, and special equipment available Patient identity confirmed I was present for the entire procedure: arm band and provided demographic data Time out: Immediately prior to procedure a "time out" was called to verify the correct patient, procedure, equipment, support staff and site/side marked as required.  Sedation type: moderate (conscious) sedation NPO time confirmed and considedered  Sedatives: PROPOFOL  Physician Time at Bedside: 5 minute  Vitals: Vital signs were monitored during sedation. Cardiac Monitor, pulse oximeter Patient tolerance: Patient tolerated the procedure well with no immediate complications. Comments: Pt with uneventful recovered. Returned to pre-procedural sedation baseline    i was present for the ebntire procedure  Orlie Dakin, MD 12/18/11 2032

## 2011-12-18 NOTE — ED Notes (Signed)
Pt was able to walk around nurses station with no pain/discomfort. EDP made aware.

## 2011-12-18 NOTE — ED Notes (Signed)
Informed pt and family of delay and that we are waiting for fluids to complete for procedure. Will continue to monitor.

## 2011-12-18 NOTE — ED Provider Notes (Signed)
History     CSN: [id number]  Arrival date & time 12/18/11  1722   First MD Initiated Contact with Patient 12/18/11 1727      Chief Complaint  Patient presents with  . Hip Injury    (Consider location/radiation/quality/duration/timing/severity/associated sxs/prior treatment) HPI Comments: Has left artificial hip x 7 years.  Felt it pop out while bending over PTA.  Can move foot.  No other injury.  Patient is a 78 y.o. male presenting with hip pain. The history is provided by the patient.  Hip Pain This is a new problem. The current episode started today. The problem occurs constantly. The problem has been unchanged. Pertinent negatives include no arthralgias, chest pain, congestion, fatigue, fever, headaches, rash or weakness. The symptoms are aggravated by nothing. He has tried nothing for the symptoms.    History reviewed. No pertinent past medical history.  Past Surgical History  Procedure Date  . Total knee arthroplasty   . Total hip arthroplasty     History reviewed. No pertinent family history.  History  Substance Use Topics  . Smoking status: Former Smoker    Quit date: 08/02/1991  . Smokeless tobacco: Current User    Types: Chew  . Alcohol Use: No      Review of Systems  Constitutional: Negative for fever, activity change and fatigue.  HENT: Negative for congestion.   Eyes: Negative for pain.  Respiratory: Negative for chest tightness, shortness of breath, wheezing and stridor.   Cardiovascular: Negative for chest pain and leg swelling.  Genitourinary: Negative for dysuria.  Musculoskeletal: Negative for arthralgias.  Skin: Negative for rash.  Neurological: Negative for weakness and headaches.  Psychiatric/Behavioral: Negative for behavioral problems.    Allergies  Review of patient's allergies indicates no known allergies.  Home Medications   Current Outpatient Rx  Name Route Sig Dispense Refill  . NAPROXEN SODIUM 220 MG PO TABS Oral Take 220 mg  by mouth daily as needed. For pain      BP 111/88  Pulse 90  Temp(Src) 98.4 F (36.9 C) (Oral)  Resp 20  Ht 6' (1.829 m)  Wt 215 lb (97.523 kg)  BMI 29.16 kg/m2  SpO2 100%  Physical Exam  Constitutional: He is oriented to person, place, and time. He appears well-developed and well-nourished. No distress.  HENT:  Head: Normocephalic and atraumatic.  Eyes: Conjunctivae and EOM are normal. Pupils are equal, round, and reactive to light. No scleral icterus.  Neck: Normal range of motion. Neck supple.  Cardiovascular: Normal rate and regular rhythm.  Exam reveals no gallop and no friction rub.   No murmur heard. Pulmonary/Chest: Effort normal and breath sounds normal. No respiratory distress. He has no wheezes. He has no rales. He exhibits no tenderness.  Abdominal: Soft. He exhibits no distension and no mass. There is no tenderness. There is no rebound and no guarding.  Musculoskeletal: Normal range of motion. He exhibits no edema and no tenderness.       L leg shortened, internally rotated.  TTP L hip.  2+ DP.  Moving all toes.  Neurological: He is alert and oriented to person, place, and time. He has normal reflexes. No cranial nerve deficit. He exhibits normal muscle tone. Coordination normal.  Skin: Skin is warm and dry. No rash noted. He is not diaphoretic. No erythema.  Psychiatric: He has a normal mood and affect. His behavior is normal. Judgment and thought content normal.    ED Course  ORTHOPEDIC INJURY TREATMENT Date/Time: 12/18/2011 8:30  PM Performed by: Dyke Brackett Authorized by: Orlie Dakin Consent: Verbal consent obtained. Written consent obtained. Risks and benefits: risks, benefits and alternatives were discussed Consent given by: patient Patient identity confirmed: arm band Time out: Immediately prior to procedure a "time out" was called to verify the correct patient, procedure, equipment, support staff and site/side marked as required. Injury location:  hip Location details: left hip Injury type: dislocation Dislocation type: posterior Spontaneous dislocation: yes Prosthesis: yes Pre-procedure neurovascular assessment: neurovascularly intact Pre-procedure distal perfusion: normal Pre-procedure neurological function: normal Pre-procedure range of motion: reduced Local anesthesia used: no Patient sedated: yes Sedation type: moderate (conscious) sedation Sedatives: propofol Sedation start date/time: 12/18/2011 8:24 PM Sedation end date/time: 12/18/2011 8:29 PM Vitals: Vital signs were monitored during sedation. Manipulation performed: yes Reduction method: traction and counter traction Reduction successful: yes X-ray confirmed reduction: yes Post-procedure neurovascular assessment: post-procedure neurovascularly intact Post-procedure distal perfusion: normal Post-procedure neurological function: normal Post-procedure range of motion: normal Patient tolerance: Patient tolerated the procedure well with no immediate complications.   (including critical care time)  Labs Reviewed - No data to display Dg Hip Complete Left  12/18/2011  *RADIOLOGY REPORT*  Clinical Data: Post reduction left hip  LEFT HIP - COMPLETE 2+ VIEW  Comparison: 12/18/2011  Findings: Interval left hip arthroplasty reduction.  The femoral component is now seated within the acetabular component.  No periprosthetic lucency identified.  No fracture identified. Advanced right hip DJD.  Advanced lower lumbar spine DJD.  IMPRESSION: Interval left hip arthroplasty reduction.  Original Report Authenticated By: Suanne Marker, M.D.   Dg Hip Complete Left  12/18/2011  *RADIOLOGY REPORT*  Clinical Data: Left proximal femoral prosthesis dislocation.  LEFT HIP - COMPLETE 2+ VIEW  Comparison: Radiographs dated 07/19/2010  Findings: There is posterior superior dislocation of the head of the femoral component of the left total hip prosthesis.  No fracture.  Acetabular component  appears in good position.  IMPRESSION: Posterior superior dislocation of the head of the femoral component of the left hip prosthesis.  Original Report Authenticated By: Larey Seat, M.D.     1. Dislocation of hip prosthesis       MDM  Has left artificial hip x 7 years.  Felt it pop out while bending over PTA.  Can move foot.  No other injury.  VSS and otherwise without injury.  N/V intact.  Xray confirms dislocation.  Treated pain.  D/w orthopedist on call - recommended reduction in ED.  Reduced hip under conscious sedation with propofol.  Confirmed by xray.  Remains N/V intact.  Ambulatory in ED with baseline mobility.  To f/u with his orthppedist this week.  Pt comfortable with plan and will follow up.         Dyke Brackett, MD 12/18/11 [phone]

## 2011-12-18 NOTE — ED Notes (Signed)
Per ems: pt was working on car and felt L hip pop out of place. Pt had L hip replacement 7 years ago and has popped out twice since. VSS, CMS intact. PIV started and 69mcg fentanyl administered with pain relief.

## 2011-12-18 NOTE — ED Notes (Signed)
Procedure completed by Dr. Marcello Moores at this time

## 2011-12-18 NOTE — ED Notes (Signed)
Transporting pt to Xray on zoll and cardiac monitor.

## 2011-12-18 NOTE — ED Notes (Signed)
Family updated as to patient's status.

## 2011-12-18 NOTE — Discharge Instructions (Signed)
Closed Reduction for Artificial Hip Dislocation  Care After Please read the instructions outlined below. Refer to these instructions for the next few weeks. These discharge instructions provide you with general information on caring for yourself after your closed reduction. Your caregiver may also give you specific instructions. If you have any problems or questions after discharge, please call your caregiver. HOME CARE INSTRUCTIONS   You may resume a normal diet and activities as directed or allowed.   Only take over-the-counter or prescription medicines for pain, discomfort, or fever as directed by your caregiver.   Use crutches as instructed. When you are allowed full weight bearing, begin slowly. Reduce the activity if it is causing pain or discomfort.   If you have been given a brace, wear it as instructed.   Apply ice to the injured hip for 15 to 20 minutes, 3 to 4 times per day while awake for 2 days. Put the ice in a plastic bag and place a thin towel between the bag of ice and your cast.   Do range of motion exercises only as instructed by your caregiver.  SEEK MEDICAL CARE IF:   You have swelling or tenderness of your calf or leg.   You develop shortness of breath.   You have increasing pain or discomfort in your hip which is not relieved with medicine.   You notice increasing swelling around the hip.   You have any numbness or tingling in your hip or leg.   You are not improving or are getting worse.   You have any other questions or concerns.  SEEK IMMEDIATE MEDICAL CARE IF:   Your hip re-dislocates.   You develop a rash.   You have difficulty breathing.   You develop chest pain.   You develop any reaction or side effects to medicines given.  MAKE SURE YOU:   Understand these instructions.   Will watch your condition.   Will get help right away if you are not doing well or get worse.  Document Released: 02/04/2005 Document Revised: 07/07/2011 Document  Reviewed: 07/18/2005 [facility] Patient Information 2012 [place].

## 2011-12-18 NOTE — ED Notes (Signed)
Patient transported to X-ray 

## 2011-12-19 NOTE — ED Provider Notes (Signed)
I have personally seen and examined the patient.  I have discussed the plan of care with the resident.  I have reviewed the documentation on PMH/FH/Soc. History.  I have reviewed the documentation of the resident and agree.  Orlie Dakin, MD 12/19/11 [phone]

## 2011-12-29 DIAGNOSIS — N039 Chronic nephritic syndrome with unspecified morphologic changes: Secondary | ICD-10-CM | POA: Diagnosis not present

## 2011-12-29 DIAGNOSIS — E785 Hyperlipidemia, unspecified: Secondary | ICD-10-CM | POA: Diagnosis not present

## 2011-12-29 DIAGNOSIS — D631 Anemia in chronic kidney disease: Secondary | ICD-10-CM | POA: Diagnosis not present

## 2011-12-29 DIAGNOSIS — N183 Chronic kidney disease, stage 3 unspecified: Secondary | ICD-10-CM | POA: Diagnosis not present

## 2011-12-29 DIAGNOSIS — I10 Essential (primary) hypertension: Secondary | ICD-10-CM | POA: Diagnosis not present

## 2012-01-05 DIAGNOSIS — M47817 Spondylosis without myelopathy or radiculopathy, lumbosacral region: Secondary | ICD-10-CM | POA: Diagnosis not present

## 2012-01-30 DIAGNOSIS — M545 Low back pain, unspecified: Secondary | ICD-10-CM | POA: Diagnosis not present

## 2012-05-08 DIAGNOSIS — N2581 Secondary hyperparathyroidism of renal origin: Secondary | ICD-10-CM | POA: Diagnosis not present

## 2012-05-08 DIAGNOSIS — D649 Anemia, unspecified: Secondary | ICD-10-CM | POA: Diagnosis not present

## 2012-05-08 DIAGNOSIS — Z23 Encounter for immunization: Secondary | ICD-10-CM | POA: Diagnosis not present

## 2012-05-08 DIAGNOSIS — D509 Iron deficiency anemia, unspecified: Secondary | ICD-10-CM | POA: Diagnosis not present

## 2012-05-08 DIAGNOSIS — N184 Chronic kidney disease, stage 4 (severe): Secondary | ICD-10-CM | POA: Diagnosis not present

## 2012-05-08 DIAGNOSIS — I129 Hypertensive chronic kidney disease with stage 1 through stage 4 chronic kidney disease, or unspecified chronic kidney disease: Secondary | ICD-10-CM | POA: Diagnosis not present

## 2012-06-29 DIAGNOSIS — E785 Hyperlipidemia, unspecified: Secondary | ICD-10-CM | POA: Diagnosis not present

## 2012-06-29 DIAGNOSIS — Z125 Encounter for screening for malignant neoplasm of prostate: Secondary | ICD-10-CM | POA: Diagnosis not present

## 2012-06-29 DIAGNOSIS — I10 Essential (primary) hypertension: Secondary | ICD-10-CM | POA: Diagnosis not present

## 2012-07-06 DIAGNOSIS — I10 Essential (primary) hypertension: Secondary | ICD-10-CM | POA: Diagnosis not present

## 2012-07-06 DIAGNOSIS — N183 Chronic kidney disease, stage 3 unspecified: Secondary | ICD-10-CM | POA: Diagnosis not present

## 2012-07-06 DIAGNOSIS — Z125 Encounter for screening for malignant neoplasm of prostate: Secondary | ICD-10-CM | POA: Diagnosis not present

## 2012-07-06 DIAGNOSIS — Z0000 Encounter for general adult medical examination without abnormal findings: Secondary | ICD-10-CM | POA: Diagnosis not present

## 2012-07-06 DIAGNOSIS — E785 Hyperlipidemia, unspecified: Secondary | ICD-10-CM | POA: Diagnosis not present

## 2012-07-10 DIAGNOSIS — Z1212 Encounter for screening for malignant neoplasm of rectum: Secondary | ICD-10-CM | POA: Diagnosis not present

## 2012-07-11 DIAGNOSIS — M545 Low back pain, unspecified: Secondary | ICD-10-CM | POA: Diagnosis not present

## 2012-08-28 DIAGNOSIS — M5137 Other intervertebral disc degeneration, lumbosacral region: Secondary | ICD-10-CM | POA: Diagnosis not present

## 2012-08-28 DIAGNOSIS — M545 Low back pain, unspecified: Secondary | ICD-10-CM | POA: Diagnosis not present

## 2012-08-28 DIAGNOSIS — M4310 Spondylolisthesis, site unspecified: Secondary | ICD-10-CM | POA: Diagnosis not present

## 2012-08-28 DIAGNOSIS — M47817 Spondylosis without myelopathy or radiculopathy, lumbosacral region: Secondary | ICD-10-CM | POA: Diagnosis not present

## 2012-08-28 DIAGNOSIS — M48061 Spinal stenosis, lumbar region without neurogenic claudication: Secondary | ICD-10-CM | POA: Diagnosis not present

## 2012-08-28 DIAGNOSIS — M546 Pain in thoracic spine: Secondary | ICD-10-CM | POA: Diagnosis not present

## 2012-09-27 ENCOUNTER — Other Ambulatory Visit: Payer: Self-pay | Admitting: Neurosurgery

## 2012-09-27 DIAGNOSIS — M48061 Spinal stenosis, lumbar region without neurogenic claudication: Secondary | ICD-10-CM | POA: Diagnosis not present

## 2012-10-02 ENCOUNTER — Encounter (HOSPITAL_COMMUNITY)
Admission: RE | Admit: 2012-10-02 | Discharge: 2012-10-02 | Disposition: A | Discharge status: Home / Self Care | Payer: Medicare Other | Source: Ambulatory Visit | Attending: Neurosurgery | Admitting: Neurosurgery

## 2012-10-02 ENCOUNTER — Encounter (HOSPITAL_COMMUNITY): Admission: RE | Admit: 2012-10-02 | Payer: Medicare Other | Source: Ambulatory Visit

## 2012-10-02 ENCOUNTER — Encounter (HOSPITAL_COMMUNITY): Payer: Self-pay

## 2012-10-02 DIAGNOSIS — M4310 Spondylolisthesis, site unspecified: Secondary | ICD-10-CM | POA: Diagnosis not present

## 2012-10-02 DIAGNOSIS — R279 Unspecified lack of coordination: Secondary | ICD-10-CM | POA: Diagnosis not present

## 2012-10-02 DIAGNOSIS — Z01818 Encounter for other preprocedural examination: Secondary | ICD-10-CM | POA: Diagnosis not present

## 2012-10-02 DIAGNOSIS — Q762 Congenital spondylolisthesis: Secondary | ICD-10-CM | POA: Diagnosis not present

## 2012-10-02 DIAGNOSIS — Z96649 Presence of unspecified artificial hip joint: Secondary | ICD-10-CM | POA: Diagnosis not present

## 2012-10-02 DIAGNOSIS — R269 Unspecified abnormalities of gait and mobility: Secondary | ICD-10-CM | POA: Diagnosis not present

## 2012-10-02 DIAGNOSIS — Z4889 Encounter for other specified surgical aftercare: Secondary | ICD-10-CM | POA: Diagnosis not present

## 2012-10-02 DIAGNOSIS — M4120 Other idiopathic scoliosis, site unspecified: Secondary | ICD-10-CM | POA: Diagnosis not present

## 2012-10-02 DIAGNOSIS — J984 Other disorders of lung: Secondary | ICD-10-CM | POA: Diagnosis not present

## 2012-10-02 DIAGNOSIS — M542 Cervicalgia: Secondary | ICD-10-CM | POA: Diagnosis not present

## 2012-10-02 DIAGNOSIS — M159 Polyosteoarthritis, unspecified: Secondary | ICD-10-CM | POA: Diagnosis not present

## 2012-10-02 DIAGNOSIS — N189 Chronic kidney disease, unspecified: Secondary | ICD-10-CM | POA: Diagnosis present

## 2012-10-02 DIAGNOSIS — M549 Dorsalgia, unspecified: Secondary | ICD-10-CM | POA: Diagnosis not present

## 2012-10-02 DIAGNOSIS — M48061 Spinal stenosis, lumbar region without neurogenic claudication: Secondary | ICD-10-CM | POA: Diagnosis not present

## 2012-10-02 DIAGNOSIS — Z23 Encounter for immunization: Secondary | ICD-10-CM | POA: Diagnosis not present

## 2012-10-02 DIAGNOSIS — Z87891 Personal history of nicotine dependence: Secondary | ICD-10-CM | POA: Diagnosis not present

## 2012-10-02 DIAGNOSIS — M51379 Other intervertebral disc degeneration, lumbosacral region without mention of lumbar back pain or lower extremity pain: Secondary | ICD-10-CM | POA: Diagnosis present

## 2012-10-02 DIAGNOSIS — M4150 Other secondary scoliosis, site unspecified: Secondary | ICD-10-CM | POA: Diagnosis not present

## 2012-10-02 DIAGNOSIS — M519 Unspecified thoracic, thoracolumbar and lumbosacral intervertebral disc disorder: Secondary | ICD-10-CM | POA: Diagnosis not present

## 2012-10-02 DIAGNOSIS — R339 Retention of urine, unspecified: Secondary | ICD-10-CM | POA: Diagnosis not present

## 2012-10-02 DIAGNOSIS — M6281 Muscle weakness (generalized): Secondary | ICD-10-CM | POA: Diagnosis not present

## 2012-10-02 DIAGNOSIS — Z96659 Presence of unspecified artificial knee joint: Secondary | ICD-10-CM | POA: Diagnosis not present

## 2012-10-02 DIAGNOSIS — M5137 Other intervertebral disc degeneration, lumbosacral region: Secondary | ICD-10-CM | POA: Diagnosis not present

## 2012-10-02 DIAGNOSIS — M47817 Spondylosis without myelopathy or radiculopathy, lumbosacral region: Secondary | ICD-10-CM | POA: Diagnosis not present

## 2012-10-02 DIAGNOSIS — I129 Hypertensive chronic kidney disease with stage 1 through stage 4 chronic kidney disease, or unspecified chronic kidney disease: Secondary | ICD-10-CM | POA: Diagnosis present

## 2012-10-02 DIAGNOSIS — I10 Essential (primary) hypertension: Secondary | ICD-10-CM | POA: Diagnosis not present

## 2012-10-02 DIAGNOSIS — Z01812 Encounter for preprocedural laboratory examination: Secondary | ICD-10-CM | POA: Diagnosis not present

## 2012-10-02 HISTORY — DX: Calculus of kidney: N20.0

## 2012-10-02 HISTORY — DX: Chronic kidney disease, unspecified: N18.9

## 2012-10-02 HISTORY — DX: Essential (primary) hypertension: I10

## 2012-10-02 HISTORY — DX: Unspecified osteoarthritis, unspecified site: M19.90

## 2012-10-02 LAB — SURGICAL PCR SCREEN
MRSA, PCR: NEGATIVE
Staphylococcus aureus: POSITIVE — AB

## 2012-10-02 LAB — COMPREHENSIVE METABOLIC PANEL
ALT: 11 U/L (ref 0–53)
AST: 16 U/L (ref 0–37)
Albumin: 4 g/dL (ref 3.5–5.2)
Alkaline Phosphatase: 127 U/L — ABNORMAL HIGH (ref 39–117)
BUN: 22 mg/dL (ref 6–23)
CO2: 29 mEq/L (ref 19–32)
Calcium: 10.9 mg/dL — ABNORMAL HIGH (ref 8.4–10.5)
Chloride: 106 mEq/L (ref 96–112)
Creatinine, Ser: 1.29 mg/dL (ref 0.50–1.35)
GFR calc Af Amer: 59 mL/min — ABNORMAL LOW (ref >90)
GFR calc non Af Amer: 51 mL/min — ABNORMAL LOW (ref >90)
Glucose, Bld: 85 mg/dL (ref 70–99)
Potassium: 4.3 mEq/L (ref 3.5–5.1)
Sodium: 145 mEq/L (ref 135–145)
Total Bilirubin: 0.3 mg/dL (ref 0.3–1.2)
Total Protein: 6.9 g/dL (ref 6.0–8.3)

## 2012-10-02 LAB — CBC
HCT: 38.2 % — ABNORMAL LOW (ref 39.0–52.0)
Hemoglobin: 12.9 g/dL — ABNORMAL LOW (ref 13.0–17.0)
MCH: 32.3 pg (ref 26.0–34.0)
MCHC: 33.8 g/dL (ref 30.0–36.0)
MCV: 95.5 fL (ref 78.0–100.0)
Platelets: 192 10*3/uL (ref 150–400)
RBC: 4 MIL/uL — ABNORMAL LOW (ref 4.22–5.81)
RDW: 14.4 % (ref 11.5–15.5)
WBC: 8 10*3/uL (ref 4.0–10.5)

## 2012-10-02 LAB — ABO/RH: ABO/RH(D): A POS

## 2012-10-02 LAB — TYPE AND SCREEN
ABO/RH(D): A POS
Antibody Screen: NEGATIVE

## 2012-10-02 MED ORDER — CEFAZOLIN SODIUM-DEXTROSE 2-3 GM-% IV SOLR
2.0000 g | INTRAVENOUS | Status: AC
  Administered 2012-10-03 (×2): 2 g via INTRAVENOUS
  Filled 2012-10-02: qty 50

## 2012-10-02 NOTE — Progress Notes (Signed)
Pt stated he had seen someone at [facility] around 10 yrs ago..."for something, but it came out all right"..he has had no problems with chest pain, discomfort....Marland KitchenMarland KitchenHas never been back. Have requested copy of EKG from Dr. Jacquiline Doe office [facility])... . And Patient left prior to getting his chest xray done---will need one DOS!!!  DA

## 2012-10-02 NOTE — Pre-Procedure Instructions (Addendum)
RUTILIO GRILLI  10/02/2012   Your procedure is scheduled on:  Wednesday, March 5th  Report to [place] at 11:00 AM.  Call this number if you have problems the morning of surgery: [phone]   Remember:   Do not eat food or drink liquids after midnight Tuesday.   Take these medicines the morning of surgery with A SIP OF WATER: nothing   Do not wear jewelry. Do not wear lotions, powders, or colognes. You may NOT wear deodorant.   Men may shave face and neck.   Do not bring valuables to the hospital.  Contacts, dentures or bridgework may not be worn into surgery.   Leave suitcase in the car. After surgery it may be brought to your room.   Patients discharged the day of surgery will not be allowed to drive home.   Name and phone number of your driver:    Special Instructions: Shower using CHG 2 nights before surgery and the night before surgery.  If you shower the day of surgery use CHG.  Use special wash - you have one bottle of CHG for all showers.  You should use approximately 1/3 of the bottle for each shower.   Please read over the following fact sheets that you were given: Pain Booklet, Coughing and Deep Breathing, Blood Transfusion Information, MRSA Information and Surgical Site Infection Prevention

## 2012-10-03 ENCOUNTER — Encounter (HOSPITAL_COMMUNITY): Payer: Self-pay | Admitting: Anesthesiology

## 2012-10-03 ENCOUNTER — Inpatient Hospital Stay (HOSPITAL_COMMUNITY): Payer: Medicare Other | Admitting: Anesthesiology

## 2012-10-03 ENCOUNTER — Inpatient Hospital Stay (HOSPITAL_COMMUNITY): Payer: Medicare Other

## 2012-10-03 ENCOUNTER — Inpatient Hospital Stay (HOSPITAL_COMMUNITY)
Admission: RE | Admit: 2012-10-03 | Discharge: 2012-10-06 | DRG: 458 | Disposition: A | Discharge status: Skilled Nursing Facility | Payer: Medicare Other | Source: Ambulatory Visit | Attending: Neurosurgery | Admitting: Neurosurgery

## 2012-10-03 ENCOUNTER — Encounter (HOSPITAL_COMMUNITY)
Admission: RE | Disposition: A | Discharge status: Skilled Nursing Facility | Payer: Self-pay | Source: Ambulatory Visit | Attending: Neurosurgery

## 2012-10-03 DIAGNOSIS — M48061 Spinal stenosis, lumbar region without neurogenic claudication: Secondary | ICD-10-CM | POA: Diagnosis not present

## 2012-10-03 DIAGNOSIS — Z01812 Encounter for preprocedural laboratory examination: Secondary | ICD-10-CM

## 2012-10-03 DIAGNOSIS — N189 Chronic kidney disease, unspecified: Secondary | ICD-10-CM | POA: Diagnosis present

## 2012-10-03 DIAGNOSIS — M5137 Other intervertebral disc degeneration, lumbosacral region: Secondary | ICD-10-CM | POA: Diagnosis present

## 2012-10-03 DIAGNOSIS — Z23 Encounter for immunization: Secondary | ICD-10-CM

## 2012-10-03 DIAGNOSIS — I129 Hypertensive chronic kidney disease with stage 1 through stage 4 chronic kidney disease, or unspecified chronic kidney disease: Secondary | ICD-10-CM | POA: Diagnosis present

## 2012-10-03 DIAGNOSIS — M47817 Spondylosis without myelopathy or radiculopathy, lumbosacral region: Secondary | ICD-10-CM | POA: Diagnosis present

## 2012-10-03 DIAGNOSIS — R339 Retention of urine, unspecified: Secondary | ICD-10-CM | POA: Diagnosis not present

## 2012-10-03 DIAGNOSIS — Q762 Congenital spondylolisthesis: Secondary | ICD-10-CM

## 2012-10-03 DIAGNOSIS — Z96659 Presence of unspecified artificial knee joint: Secondary | ICD-10-CM

## 2012-10-03 DIAGNOSIS — M4120 Other idiopathic scoliosis, site unspecified: Principal | ICD-10-CM | POA: Diagnosis present

## 2012-10-03 DIAGNOSIS — Z01818 Encounter for other preprocedural examination: Secondary | ICD-10-CM | POA: Diagnosis not present

## 2012-10-03 DIAGNOSIS — Z87891 Personal history of nicotine dependence: Secondary | ICD-10-CM

## 2012-10-03 DIAGNOSIS — Z96649 Presence of unspecified artificial hip joint: Secondary | ICD-10-CM

## 2012-10-03 DIAGNOSIS — M51379 Other intervertebral disc degeneration, lumbosacral region without mention of lumbar back pain or lower extremity pain: Secondary | ICD-10-CM | POA: Diagnosis present

## 2012-10-03 SURGERY — POSTERIOR LUMBAR FUSION 3 LEVEL
Anesthesia: General | Site: Back | Wound class: Clean

## 2012-10-03 MED ORDER — HEMOSTATIC AGENTS (NO CHARGE) OPTIME
TOPICAL | Status: DC | PRN
  Administered 2012-10-03: 1 via TOPICAL

## 2012-10-03 MED ORDER — SODIUM CHLORIDE 0.9 % IR SOLN
Status: DC | PRN
  Administered 2012-10-03: 13:00:00

## 2012-10-03 MED ORDER — GLYCOPYRROLATE 0.2 MG/ML IJ SOLN
INTRAMUSCULAR | Status: DC | PRN
  Administered 2012-10-03: 0.4 mg via INTRAVENOUS

## 2012-10-03 MED ORDER — SODIUM CHLORIDE 0.9 % IV SOLN
INTRAVENOUS | Status: DC | PRN
  Administered 2012-10-03: 16:00:00 via INTRAVENOUS

## 2012-10-03 MED ORDER — FUROSEMIDE 20 MG PO TABS
20.0000 mg | ORAL_TABLET | Freq: Every evening | ORAL | Status: DC
  Administered 2012-10-04 – 2012-10-05 (×2): 20 mg via ORAL
  Filled 2012-10-03 (×4): qty 1

## 2012-10-03 MED ORDER — ZOLPIDEM TARTRATE 5 MG PO TABS: 5.0000 mg | ORAL_TABLET | Freq: Every evening | ORAL | Status: DC | PRN

## 2012-10-03 MED ORDER — THROMBIN 20000 UNITS EX KIT
PACK | CUTANEOUS | Status: DC | PRN
  Administered 2012-10-03: 20000 [IU] via TOPICAL

## 2012-10-03 MED ORDER — MORPHINE SULFATE 4 MG/ML IJ SOLN: 4.0000 mg | INTRAMUSCULAR | Status: DC | PRN

## 2012-10-03 MED ORDER — SODIUM CHLORIDE 0.9 % IV SOLN
INTRAVENOUS | Status: AC
  Filled 2012-10-03: qty 500

## 2012-10-03 MED ORDER — PHENYLEPHRINE HCL 10 MG/ML IJ SOLN
INTRAMUSCULAR | Status: DC | PRN
  Administered 2012-10-03 (×3): 120 ug via INTRAVENOUS

## 2012-10-03 MED ORDER — ROCURONIUM BROMIDE 100 MG/10ML IV SOLN
INTRAVENOUS | Status: DC | PRN
  Administered 2012-10-03: 50 mg via INTRAVENOUS
  Administered 2012-10-03: 5 mg via INTRAVENOUS
  Administered 2012-10-03: 10 mg via INTRAVENOUS

## 2012-10-03 MED ORDER — PHENYLEPHRINE HCL 10 MG/ML IJ SOLN
10.0000 mg | INTRAVENOUS | Status: DC | PRN
  Administered 2012-10-03: 25 ug/min via INTRAVENOUS

## 2012-10-03 MED ORDER — LIDOCAINE-EPINEPHRINE 1 %-1:100000 IJ SOLN
INTRAMUSCULAR | Status: DC | PRN
  Administered 2012-10-03: 15 mL via INTRADERMAL

## 2012-10-03 MED ORDER — TAMSULOSIN HCL 0.4 MG PO CAPS
0.4000 mg | ORAL_CAPSULE | Freq: Every day | ORAL | Status: DC
  Administered 2012-10-04 – 2012-10-05 (×2): 0.4 mg via ORAL
  Filled 2012-10-03 (×4): qty 1

## 2012-10-03 MED ORDER — BACITRACIN 50000 UNITS IM SOLR
INTRAMUSCULAR | Status: AC
  Filled 2012-10-03: qty 1

## 2012-10-03 MED ORDER — ONDANSETRON HCL 4 MG/2ML IJ SOLN
INTRAMUSCULAR | Status: DC | PRN
  Administered 2012-10-03: 4 mg via INTRAVENOUS

## 2012-10-03 MED ORDER — KETOROLAC TROMETHAMINE 30 MG/ML IJ SOLN
30.0000 mg | Freq: Four times a day (QID) | INTRAMUSCULAR | Status: AC
  Administered 2012-10-03 – 2012-10-05 (×8): 30 mg via INTRAVENOUS
  Filled 2012-10-03 (×10): qty 1

## 2012-10-03 MED ORDER — ARTIFICIAL TEARS OP OINT
TOPICAL_OINTMENT | OPHTHALMIC | Status: DC | PRN
  Administered 2012-10-03: 1 via OPHTHALMIC

## 2012-10-03 MED ORDER — IRBESARTAN 300 MG PO TABS
300.0000 mg | ORAL_TABLET | Freq: Every day | ORAL | Status: DC
  Administered 2012-10-04 – 2012-10-05 (×2): 300 mg via ORAL
  Filled 2012-10-03 (×4): qty 1

## 2012-10-03 MED ORDER — LIDOCAINE HCL (CARDIAC) 20 MG/ML IV SOLN
INTRAVENOUS | Status: DC | PRN
  Administered 2012-10-03: 100 mg via INTRAVENOUS

## 2012-10-03 MED ORDER — ALBUMIN HUMAN 5 % IV SOLN
INTRAVENOUS | Status: DC | PRN
  Administered 2012-10-03 (×2): via INTRAVENOUS

## 2012-10-03 MED ORDER — LIDOCAINE HCL 4 % MT SOLN
OROMUCOSAL | Status: DC | PRN
  Administered 2012-10-03: 4 mL via TOPICAL

## 2012-10-03 MED ORDER — MENTHOL 3 MG MT LOZG
1.0000 | LOZENGE | OROMUCOSAL | Status: DC | PRN
  Administered 2012-10-04: 3 mg via ORAL
  Filled 2012-10-03: qty 9

## 2012-10-03 MED ORDER — KETOROLAC TROMETHAMINE 30 MG/ML IJ SOLN: 30.0000 mg | Freq: Once | INTRAMUSCULAR | Status: DC

## 2012-10-03 MED ORDER — EPHEDRINE SULFATE 50 MG/ML IJ SOLN
INTRAMUSCULAR | Status: DC | PRN
  Administered 2012-10-03 (×2): 10 mg via INTRAVENOUS

## 2012-10-03 MED ORDER — 0.9 % SODIUM CHLORIDE (POUR BTL) OPTIME
TOPICAL | Status: DC | PRN
  Administered 2012-10-03 (×3): 1000 mL

## 2012-10-03 MED ORDER — SODIUM CHLORIDE 0.9 % IJ SOLN
3.0000 mL | Freq: Two times a day (BID) | INTRAMUSCULAR | Status: DC
  Administered 2012-10-03 – 2012-10-05 (×5): 3 mL via INTRAVENOUS

## 2012-10-03 MED ORDER — HYDROMORPHONE HCL PF 1 MG/ML IJ SOLN: 0.2500 mg | INTRAMUSCULAR | Status: DC | PRN

## 2012-10-03 MED ORDER — ACETAMINOPHEN 10 MG/ML IV SOLN
INTRAVENOUS | Status: AC
  Administered 2012-10-03: 1000 mg via INTRAVENOUS
  Filled 2012-10-03: qty 100

## 2012-10-03 MED ORDER — ALUM & MAG HYDROXIDE-SIMETH 200-200-20 MG/5ML PO SUSP: 30.0000 mL | Freq: Four times a day (QID) | ORAL | Status: DC | PRN

## 2012-10-03 MED ORDER — PHENOL 1.4 % MT LIQD: 1.0000 | OROMUCOSAL | Status: DC | PRN

## 2012-10-03 MED ORDER — OXYCODONE HCL 5 MG PO TABS
5.0000 mg | ORAL_TABLET | ORAL | Status: DC | PRN
  Administered 2012-10-05: 5 mg via ORAL
  Filled 2012-10-03: qty 1

## 2012-10-03 MED ORDER — ONDANSETRON HCL 4 MG/2ML IJ SOLN: 4.0000 mg | Freq: Once | INTRAMUSCULAR | Status: DC | PRN

## 2012-10-03 MED ORDER — ACETAMINOPHEN 10 MG/ML IV SOLN
1000.0000 mg | Freq: Four times a day (QID) | INTRAVENOUS | Status: AC
  Administered 2012-10-03 – 2012-10-04 (×4): 1000 mg via INTRAVENOUS
  Filled 2012-10-03 (×6): qty 100

## 2012-10-03 MED ORDER — LACTATED RINGERS IV SOLN
INTRAVENOUS | Status: DC | PRN
  Administered 2012-10-03 (×3): via INTRAVENOUS

## 2012-10-03 MED ORDER — SODIUM CHLORIDE 0.9 % IJ SOLN: 3.0000 mL | INTRAMUSCULAR | Status: DC | PRN

## 2012-10-03 MED ORDER — ESMOLOL HCL 10 MG/ML IV SOLN
INTRAVENOUS | Status: DC | PRN
  Administered 2012-10-03: 20 mg via INTRAVENOUS

## 2012-10-03 MED ORDER — CYCLOBENZAPRINE HCL 10 MG PO TABS: 10.0000 mg | ORAL_TABLET | Freq: Three times a day (TID) | ORAL | Status: DC | PRN

## 2012-10-03 MED ORDER — BISACODYL 10 MG RE SUPP: 10.0000 mg | Freq: Every day | RECTAL | Status: DC | PRN

## 2012-10-03 MED ORDER — MAGNESIUM HYDROXIDE 400 MG/5ML PO SUSP: 30.0000 mL | Freq: Every day | ORAL | Status: DC | PRN

## 2012-10-03 MED ORDER — CEFAZOLIN SODIUM-DEXTROSE 2-3 GM-% IV SOLR
INTRAVENOUS | Status: AC
  Filled 2012-10-03: qty 50

## 2012-10-03 MED ORDER — FENTANYL CITRATE 0.05 MG/ML IJ SOLN
INTRAMUSCULAR | Status: DC | PRN
  Administered 2012-10-03 (×5): 50 ug via INTRAVENOUS
  Administered 2012-10-03: 75 ug via INTRAVENOUS
  Administered 2012-10-03 (×3): 50 ug via INTRAVENOUS
  Administered 2012-10-03: 75 ug via INTRAVENOUS

## 2012-10-03 MED ORDER — PNEUMOCOCCAL VAC POLYVALENT 25 MCG/0.5ML IJ INJ
0.5000 mL | INJECTION | INTRAMUSCULAR | Status: AC
  Administered 2012-10-04: 0.5 mL via INTRAMUSCULAR
  Filled 2012-10-03: qty 0.5

## 2012-10-03 MED ORDER — ACETAMINOPHEN 650 MG RE SUPP: 650.0000 mg | RECTAL | Status: DC | PRN

## 2012-10-03 MED ORDER — KCL IN DEXTROSE-NACL 20-5-0.45 MEQ/L-%-% IV SOLN
INTRAVENOUS | Status: DC
  Administered 2012-10-04: 02:00:00 via INTRAVENOUS
  Filled 2012-10-03 (×8): qty 1000

## 2012-10-03 MED ORDER — ACETAMINOPHEN 325 MG PO TABS
650.0000 mg | ORAL_TABLET | ORAL | Status: DC | PRN
  Administered 2012-10-05: 650 mg via ORAL
  Administered 2012-10-05: 325 mg via ORAL
  Filled 2012-10-03: qty 2
  Filled 2012-10-03: qty 1

## 2012-10-03 MED ORDER — PROPOFOL 10 MG/ML IV BOLUS
INTRAVENOUS | Status: DC | PRN
  Administered 2012-10-03: 125 mg via INTRAVENOUS

## 2012-10-03 MED ORDER — BUPIVACAINE HCL (PF) 0.5 % IJ SOLN
INTRAMUSCULAR | Status: DC | PRN
  Administered 2012-10-03: 15 mL

## 2012-10-03 MED ORDER — MUPIROCIN 2 % EX OINT
TOPICAL_OINTMENT | Freq: Two times a day (BID) | CUTANEOUS | Status: DC
  Administered 2012-10-04 – 2012-10-05 (×4): via NASAL
  Filled 2012-10-03: qty 22

## 2012-10-03 MED ORDER — NEOSTIGMINE METHYLSULFATE 1 MG/ML IJ SOLN
INTRAMUSCULAR | Status: DC | PRN
  Administered 2012-10-03: 3 mg via INTRAVENOUS

## 2012-10-03 SURGICAL SUPPLY — 86 items
ADH SKN CLS APL DERMABOND .7 (GAUZE/BANDAGES/DRESSINGS) ×3
ADH SKN CLS LQ APL DERMABOND (GAUZE/BANDAGES/DRESSINGS) ×1
BAG DECANTER FOR FLEXI CONT (MISCELLANEOUS) ×2 IMPLANT
BLADE SURG ROTATE 9660 (MISCELLANEOUS) ×1 IMPLANT
BRUSH SCRUB EZ PLAIN DRY (MISCELLANEOUS) ×2 IMPLANT
BUR ACRON 5.0MM COATED (BURR) ×3 IMPLANT
BUR MATCHSTICK NEURO 3.0 LAGG (BURR) ×2 IMPLANT
CANISTER SUCTION 2500CC (MISCELLANEOUS) ×2 IMPLANT
CAP LCK SPNE (Orthopedic Implant) ×10 IMPLANT
CAP LOCK SPINE RADIUS (Orthopedic Implant) IMPLANT
CAP LOCKING (Orthopedic Implant) ×20 IMPLANT
CLOTH BEACON ORANGE TIMEOUT ST (SAFETY) ×2 IMPLANT
CONT SPEC 4OZ CLIKSEAL STRL BL (MISCELLANEOUS) ×4 IMPLANT
COVER BACK TABLE 24X17X13 BIG (DRAPES) IMPLANT
COVER TABLE BACK 60X90 (DRAPES) ×2 IMPLANT
CROSSLINK VARIABLE M-A (Orthopedic Implant) ×1 IMPLANT
D-58 TAPERED ROUTER, 1.7MM X 16.0MM ×1 IMPLANT
DERMABOND ADHESIVE PROPEN (GAUZE/BANDAGES/DRESSINGS) ×1
DERMABOND ADVANCED (GAUZE/BANDAGES/DRESSINGS) ×3
DERMABOND ADVANCED .7 DNX12 (GAUZE/BANDAGES/DRESSINGS) ×2 IMPLANT
DERMABOND ADVANCED .7 DNX6 (GAUZE/BANDAGES/DRESSINGS) IMPLANT
DRAPE C-ARM 42X72 X-RAY (DRAPES) ×4 IMPLANT
DRAPE C-ARMOR (DRAPES) ×1 IMPLANT
DRAPE LAPAROTOMY 100X72X124 (DRAPES) ×2 IMPLANT
DRAPE POUCH INSTRU U-SHP 10X18 (DRAPES) ×2 IMPLANT
DRAPE PROXIMA HALF (DRAPES) ×4 IMPLANT
DRAPE SURG 17X23 STRL (DRAPES) ×1 IMPLANT
DRSG EMULSION OIL 3X3 NADH (GAUZE/BANDAGES/DRESSINGS) IMPLANT
ELECT CAUTERY BLADE 6.4 (BLADE) ×1 IMPLANT
ELECT REM PT RETURN 9FT ADLT (ELECTROSURGICAL) ×2
ELECTRODE REM PT RTRN 9FT ADLT (ELECTROSURGICAL) ×1 IMPLANT
GAUZE SPONGE 4X4 16PLY XRAY LF (GAUZE/BANDAGES/DRESSINGS) IMPLANT
GLOVE BIO SURGEON STRL SZ8 (GLOVE) ×1 IMPLANT
GLOVE BIOGEL PI IND STRL 8 (GLOVE) ×2 IMPLANT
GLOVE BIOGEL PI INDICATOR 8 (GLOVE) ×2
GLOVE ECLIPSE 6.5 STRL STRAW (GLOVE) ×1 IMPLANT
GLOVE ECLIPSE 7.5 STRL STRAW (GLOVE) ×8 IMPLANT
GLOVE EXAM NITRILE LRG STRL (GLOVE) IMPLANT
GLOVE EXAM NITRILE MD LF STRL (GLOVE) ×1 IMPLANT
GLOVE EXAM NITRILE XL STR (GLOVE) IMPLANT
GLOVE EXAM NITRILE XS STR PU (GLOVE) IMPLANT
GOWN BRE IMP SLV AUR LG STRL (GOWN DISPOSABLE) IMPLANT
GOWN BRE IMP SLV AUR XL STRL (GOWN DISPOSABLE) ×4 IMPLANT
GOWN STRL REIN 2XL LVL4 (GOWN DISPOSABLE) IMPLANT
KIT BASIN OR (CUSTOM PROCEDURE TRAY) ×2 IMPLANT
KIT INFUSE SMALL (Orthopedic Implant) ×1 IMPLANT
KIT ROOM TURNOVER OR (KITS) ×2 IMPLANT
MILL MEDIUM DISP (BLADE) ×2 IMPLANT
NDL 18GX1X1/2 (RX/OR ONLY) (NEEDLE) ×1 IMPLANT
NDL HYPO 25X1 1.5 SAFETY (NEEDLE) ×1 IMPLANT
NDL SPNL 18GX3.5 QUINCKE PK (NEEDLE) ×1 IMPLANT
NDL SPNL 22GX3.5 QUINCKE BK (NEEDLE) ×1 IMPLANT
NEEDLE 18GX1X1/2 (RX/OR ONLY) (NEEDLE) ×2 IMPLANT
NEEDLE BONE MARROW 8GAX6 (NEEDLE) IMPLANT
NEEDLE HYPO 25X1 1.5 SAFETY (NEEDLE) IMPLANT
NEEDLE SPNL 18GX3.5 QUINCKE PK (NEEDLE) ×4 IMPLANT
NEEDLE SPNL 22GX3.5 QUINCKE BK (NEEDLE) ×2 IMPLANT
NS IRRIG 1000ML POUR BTL (IV SOLUTION) ×5 IMPLANT
PACK LAMINECTOMY NEURO (CUSTOM PROCEDURE TRAY) ×2 IMPLANT
PAD ARMBOARD 7.5X6 YLW CONV (MISCELLANEOUS) ×6 IMPLANT
PATTIES SURGICAL .5 X.5 (GAUZE/BANDAGES/DRESSINGS) IMPLANT
PATTIES SURGICAL .5 X1 (DISPOSABLE) IMPLANT
PATTIES SURGICAL 1X1 (DISPOSABLE) ×1 IMPLANT
ROD 80MM (Rod) ×2 IMPLANT
ROD MAX 90MM (Rod) ×1 IMPLANT
ROD SPNL 80X5.5 NS TI RDS (Rod) IMPLANT
SCREW 5.75X40M (Screw) ×1 IMPLANT
SCREW 5.75X45MM (Screw) ×6 IMPLANT
SCREW 5.75X50MM (Screw) ×1 IMPLANT
SPONGE GAUZE 4X4 12PLY (GAUZE/BANDAGES/DRESSINGS) ×1 IMPLANT
SPONGE LAP 4X18 X RAY DECT (DISPOSABLE) IMPLANT
SPONGE NEURO XRAY DETECT 1X3 (DISPOSABLE) ×1 IMPLANT
SPONGE SURGIFOAM ABS GEL 100 (HEMOSTASIS) ×2 IMPLANT
SUT BONE WAX W31G (SUTURE) ×1 IMPLANT
SUT PROLENE 6 0 BV (SUTURE) IMPLANT
SUT VIC AB 1 CT1 18XBRD ANBCTR (SUTURE) ×2 IMPLANT
SUT VIC AB 1 CT1 8-18 (SUTURE) ×6
SUT VIC AB 2-0 CP2 18 (SUTURE) ×5 IMPLANT
SYR 20CC LL (SYRINGE) ×2 IMPLANT
SYR CONTROL 10ML LL (SYRINGE) ×2 IMPLANT
TAPE CLOTH SURG 4X10 WHT LF (GAUZE/BANDAGES/DRESSINGS) ×1 IMPLANT
TOWEL OR 17X24 6PK STRL BLUE (TOWEL DISPOSABLE) ×2 IMPLANT
TOWEL OR 17X26 10 PK STRL BLUE (TOWEL DISPOSABLE) ×2 IMPLANT
TRAP SPECIMEN MUCOUS 40CC (MISCELLANEOUS) ×1 IMPLANT
TRAY FOLEY CATH 14FRSI W/METER (CATHETERS) ×2 IMPLANT
WATER STERILE IRR 1000ML POUR (IV SOLUTION) ×2 IMPLANT

## 2012-10-03 NOTE — Preoperative (Signed)
Beta Blockers   Reason not to administer Beta Blockers: not prescribed 

## 2012-10-03 NOTE — Progress Notes (Signed)
Filed Vitals:   10/03/12 1114 10/03/12 1822  BP: 142/79   Pulse: 81   Temp: 98.6 F (37 C) 98.2 F (36.8 C)  TempSrc: Oral   Resp: 16   SpO2: 96% 98%    CBC  Recent Labs  10/02/12 1146  WBC 8.0  HGB 12.9*  HCT 38.2*  PLT 192   BMET  Recent Labs  10/02/12 1146  NA 145  K 4.3  CL 106  CO2 29  GLUCOSE 85  BUN 22  CREATININE 1.29  CALCIUM 10.9*    Patient gradually becoming more alert in the PACU. Following commands. Moving all 4 extremity is well. Dressing clean and dry. Foley to straight drainage.  Plan: For transfer to 3500 once fully recovered from anesthesia. We'll progress through postoperative recovery. Spoke with patient's son about surgery in his condition.  Hosie Spangle, MD 10/03/2012, 6:55 PM

## 2012-10-03 NOTE — Anesthesia Procedure Notes (Signed)
Procedure Name: Intubation Date/Time: 10/03/2012 12:00 PM Performed by: Erik Obey Pre-anesthesia Checklist: Patient identified, Timeout performed, Suction available, Patient being monitored and Emergency Drugs available Patient Re-evaluated:Patient Re-evaluated prior to inductionOxygen Delivery Method: Circle system utilized Preoxygenation: Pre-oxygenation with 100% oxygen Intubation Type: IV induction Ventilation: Mask ventilation without difficulty and Oral airway inserted - appropriate to patient size Laryngoscope Size: Mac and 4 Grade View: Grade II Tube type: Oral Tube size: 7.5 mm Number of attempts: 1 Airway Equipment and Method: Stylet and LTA kit utilized Placement Confirmation: ETT inserted through vocal cords under direct vision,  positive ETCO2 and breath sounds checked- equal and bilateral Secured at: 23 cm Tube secured with: Tape Dental Injury: Teeth and Oropharynx as per pre-operative assessment

## 2012-10-03 NOTE — Transfer of Care (Signed)
Immediate Anesthesia Transfer of Care Note  Patient: Nathaniel [name]  Procedure(s) Performed: Procedure(s) with comments: LAMINECTOMY WITH POSTERIOR LATERAL ARTHRODESIS LEVEL 3 (N/A) - Lumbar One through Four Laminectomies and Lumbar One-Four Posterolateral Arthrodesis; No interbody placement; Hardware Only  Patient Location: PACU  Anesthesia Type:General  Level of Consciousness: awake  Airway & Oxygen Therapy: Patient Spontanous Breathing and Patient connected to face mask oxygen  Post-op Assessment: Report given to PACU RN, Post -op Vital signs reviewed and stable and Patient moving all extremities X 4  Post vital signs: Reviewed and stable  Complications: No apparent anesthesia complications

## 2012-10-03 NOTE — H&P (Signed)
Subjective: Patient is a 79 y.o. male who is admitted for treatment of a multilevel multifactorial lumbar stenosis and instability. Patient had difficulties with back pain and progressive distal lower extremity weakness with associated imbalance. X-ray show degenerative scoliosis and multilevel retrolisthesis seen at L1-2, L2-3, L3-4. There is extensive hypertrophic facet arthropathy throughout the lumbar spine. MRI scan reconfirms multilevel degenerative disease and spondylosis, with retrolisthesis at L1-2, L2-3, L3-4. There is moderate stenosis at L1-2 and L2-3, and severe stenosis at L3-4 , with more mild encroachment seen at L4-5 and L5-S1. Patient is admitted for a L1-L4 decompressive lumbar laminectomy, and L1-L4 posterior lateral arthrodesis with posterior instrumentation and bone graft     Past Medical History  Diagnosis Date  . Chronic kidney disease   . Kidney stones     15 yrs ago.  Marland Kitchen Hypertension     sees  [place]  . Arthritis     Past Surgical History  Procedure Laterality Date  . Total knee arthroplasty    . Total hip arthroplasty    . Rotator cuff repair      right shoulder  . Joint replacement      hip, knee   . Tonsillectomy      No prescriptions prior to admission   No Known Allergies  History  Substance Use Topics  . Smoking status: Former Smoker    Quit date: 08/02/1991  . Smokeless tobacco: Current User    Types: Chew  . Alcohol Use: No    No family history on file.   Review of Systems A comprehensive review of systems was negative.  Objective: Vital signs in last 24 hours: Temp:  [98.5 F (36.9 C)] 98.5 F (36.9 C) (03/04 1104) Pulse Rate:  [80] 80 (03/04 1104) Resp:  [20] 20 (03/04 1104) BP: (158)/(77) 158/77 mmHg (03/04 1104) SpO2:  [96 %] 96 % (03/04 1104) Weight:  [101.379 kg (223 lb 8 oz)] 101.379 kg (223 lb 8 oz) (03/04 1104)  EXAM:  Patient is a well-developed well-nourished white male in no acute distress. He uses a rolling walker to  support as he ambulates.  Lungs are clear to auscultation , the patient has symmetrical respiratory excursion. Heart has a regular rate and rhythm normal S1 and S2 no murmur.   Abdomen is soft nontender nondistended bowel sounds are present. Extremity examination shows no clubbing cyanosis or edema. Musculoskeletal examination shows no tenderness to palpation of the lumbar spinous processes or paralumbar musculature. He is limited in forward flexion and about 70, and is limited in extension to about 0. Straight leg raising is negative bilaterally.  Neurologic examination shows 5/5 strength in the iliopsoas, quadriceps, and dorsiflexors bilaterally.  The EHL on the left is 4, and on the right is 4 minus. The plantar flexor is 3 bilaterally. Sensation is intact to pinprick in the distal lower extremities. Reflexes are absent at the quadriceps and gastrocnemius bilaterally. Toes are downgoing bilaterally. His gait and stance are both unsteady.   Data Review:CBC    Component Value Date/Time   WBC 8.0 10/02/2012 1146   RBC 4.00* 10/02/2012 1146   HGB 12.9* 10/02/2012 1146   HCT 38.2* 10/02/2012 1146   PLT 192 10/02/2012 1146   MCV 95.5 10/02/2012 1146   MCH 32.3 10/02/2012 1146   MCHC 33.8 10/02/2012 1146   RDW 14.4 10/02/2012 1146   LYMPHSABS 0.9 07/19/2010 0609   MONOABS 0.6 07/19/2010 0609   EOSABS 0.4 07/19/2010 0609   BASOSABS 0.0 07/19/2010 [id number]  BMET    Component Value Date/Time   NA 145 10/02/2012 1146   K 4.3 10/02/2012 1146   CL 106 10/02/2012 1146   CO2 29 10/02/2012 1146   GLUCOSE 85 10/02/2012 1146   BUN 22 10/02/2012 1146   CREATININE 1.29 10/02/2012 1146   CALCIUM 10.9* 10/02/2012 1146   GFRNONAA 51* 10/02/2012 1146   GFRAA 59* 10/02/2012 1146     Assessment/Plan: Patient with multilevel multifactorial lumbar stenosis, associated with retrolisthesis at multiple levels and degenerative scoliosis. Patient is admitted now for decompression and stabilization via an L1-L4  decompressive lumbar laminectomy and L1-L4 posterior lateral arthrodesis.  I've discussed with the patient the nature of his condition, the nature the surgical procedure, the typical length of surgery, hospital stay, and overall recuperation, the limitations postoperatively, and risks of surgery. I discussed risks including risks of infection, bleeding, possibly need for transfusion, the risk of nerve root dysfunction with pain, weakness, numbness, or paresthesias, the risk of dural tear and CSF leakage and possible need for further surgery, the risk of failure of the arthrodesis and possibly for further surgery, the risk of anesthetic complications including myocardial infarction, stroke, pneumonia, and death. We discussed the need for postoperative immobilization in a lumbar brace. Understanding all this the patient does wish to proceed with surgery and is admitted for such.     Hosie Spangle, MD 10/03/2012 8:07 AM

## 2012-10-03 NOTE — Anesthesia Postprocedure Evaluation (Signed)
  Anesthesia Post-op Note  Patient: Nathaniel [name]  Procedure(s) Performed: Procedure(s) with comments: LAMINECTOMY WITH POSTERIOR LATERAL ARTHRODESIS LEVEL 3 (N/A) - Lumbar One through Four Laminectomies and Lumbar One-Four Posterolateral Arthrodesis; No interbody placement; Hardware Only  Patient Location: PACU  Anesthesia Type:General  Level of Consciousness: awake, alert  and oriented  Airway and Oxygen Therapy: Patient Spontanous Breathing and Patient connected to face mask oxygen  Post-op Pain: mild  Post-op Assessment: Post-op Vital signs reviewed  Post-op Vital Signs: Reviewed  Complications: No apparent anesthesia complications

## 2012-10-03 NOTE — Op Note (Signed)
10/03/2012  6:14 PM real  PATIENT:  Nathaniel [name]  79 y.o. male  PRE-OPERATIVE DIAGNOSIS:  lumbar stenosis, lumbar spondylolisthesis, lumbar spondylosis, lumbar degenerative disc disease  POST-OPERATIVE DIAGNOSIS:  lumbar stenosis, lumbar spondylolisthesis, lumbar spondylosis, lumbar degenerative disc disease  PROCEDURE:  Procedure(s): LAMINECTOMY WITH POSTERIOR LATERAL ARTHRODESIS LEVEL 3:  L1-L4 decompressive lumbar laminectomy and partial facetectomy with decompression of the exiting L1, L2, L3, and L4 nerve roots bilaterally, with microdissection and microsurgical technique, L1-L4 posterior lateral arthrodesis with radius posterior instrumentation, locally harvested morcellized autograft, and infuse  SURGEON:  Surgeon(s): Hosie Spangle, MD Winfield Cunas, MD  ASSISTANTS:Kyle Christella Noa, MD  ANESTHESIA:   general  EBL:  Total I/O In: 4000 [I.V.:3200; Blood:300; IV Piggyback:500] Out: [id number] [Urine:575; Blood:700]  BLOOD ADMINISTERED:250 CC CELLSAVER  COUNT: Correct per nursing staff  DICTATION:  patient was brought to the operating room, placed under general endotracheal anesthesia. Patient was turned to a prone position. The lumbar region was prepped with Betadine soap and solution and draped in a sterile fashion. A localizing x-ray was taken and the L1-L4 levels identified. The midline was infiltrated with local anesthetic with epinephrine. Midline incision made, carried down to subcutaneous tissue. Bipolar cautery and electrocautery used to maintain hemostasis. Dissection was carried down to the lumbar fascia which was incised bilaterally and the paraspinal muscles were dissected from the spinous process lamina in a subperiosteal fashion. Additional x-rays and C-arm fluoroscopic imaging were used to confirm her localization of the L1, L2, L3, and L4 levels. Dissection was carried out laterally over the markedly hypertrophic facet joints. The patient's scoliotic deformity was also  noted. We then began our decompression. Laminectomy and partial facetectomy was performed using double-action rongeurs, the high-speed drill, and Kerrison punches. Ligamentum flavum was severely thickened at the L3-4 level and moderately thickened at the L1-2 and L2-3 levels. At each level it was carefully removed, with resulting decompression of the thecal sac. Partial facetectomy was performed as well, and this allowed further removal of the ligamentum flavum laterally. We were able to decompress the neural foramina and exiting L1, L2, L3, and L4 nerve roots bilaterally. Once the decompression was completed, we proceeded with the arthrodesis. Autograft from the laminectomy had been saved and was cleaned of all soft tissue and morcellized in a bone mill. Using the C-arm we identified the pedicle screw entry points bilaterally at each level. For each pedicle it was probed with a pedicle probe, using C-arm fluoroscopic guidance. Each pedicle was examined with the ball probe, good bony surfaces were found, and then each pedicle was tapped with a 5.25 millimeters tap. Each pedicle was then again examined with a ball probe, good threading was found and good bony surfaces were found, and we placed 5.75 mm screws. At L1-1 45 lumbar screws using the left, and a 50 mm screw on the right. It L2 we used 45 mm screws bilaterally. At L3 we used a 40 mm screw on the left and 45 over screw on the right. At L4 we used 45 mm screws bilaterally. We then used a 90 mm max lordosed rods on the right. It was placed within the screw heads, locking caps were placed, and once the final construct was assembled, each of the locking caps was final tightened against a counter torque. On the left side we used an 80 mm lordosed rods but had to use a neutral offset radius connector for the L3 screw. Once the rod was in place locking caps were placed,  and again once the final construct was assembled, each of the locking caps was final tightened  against a counter torque. We then placed a variable multi-angle cross connector between the screws at L1 and L2. It was secured to the rods on each side, and both locking collars were tightened down. We then packed a combination of infuse and locally harvested morcellized autograft in the lateral gutters from L1-L4. Once the bone graft was packed we carefully irrigated the laminectomy defect and removed any bits of bone that had dropped down into that space. The wound had been irrigated numerous times throughout the procedure with saline solution and bacitracin solution. Hemostasis was confirmed. We then proceeded with closure. Paraspinal muscles, deep fascia, and Scarpa's fascia were closed with interrupted undyed 1 Vicryl sutures. The subcutaneous and subcuticular closed with interrupted inverted 2-0 Vicryl sutures. Skin is approximate Dermabond. We'll was dressed with sterile gauze and Hypafix. Following surgery the patient is to be turned back in supine position, reversed and the anesthetic, and transferred to the recovery room for further care.   PLAN OF CARE: Admit to inpatient   PATIENT DISPOSITION:  PACU - hemodynamically stable.   Delay start of Pharmacological VTE agent (>24hrs) due to surgical blood loss or risk of bleeding:  yes

## 2012-10-03 NOTE — Anesthesia Preprocedure Evaluation (Addendum)
Anesthesia Evaluation  Patient identified by MRN, date of birth, ID band Patient awake    Reviewed: Allergy & Precautions, H&P , NPO status , Patient's Chart, lab work & pertinent test results  Airway Mallampati: I TM Distance: >3 FB Neck ROM: full    Dental   Pulmonary former smoker,          Cardiovascular hypertension, Pt. on medications Rhythm:regular Rate:Normal     Neuro/Psych    GI/Hepatic   Endo/Other    Renal/GU Renal InsufficiencyRenal disease     Musculoskeletal  (+) Arthritis -,   Abdominal   Peds  Hematology   Anesthesia Other Findings   Reproductive/Obstetrics                          Anesthesia Physical Anesthesia Plan  ASA: II  Anesthesia Plan: General   Post-op Pain Management:    Induction: Intravenous  Airway Management Planned: Oral ETT  Additional Equipment:   Intra-op Plan:   Post-operative Plan: Extubation in OR  Informed Consent: I have reviewed the patients History and Physical, chart, labs and discussed the procedure including the risks, benefits and alternatives for the proposed anesthesia with the patient or authorized representative who has indicated his/her understanding and acceptance.   Dental advisory given  Plan Discussed with: Anesthesiologist and Surgeon  Anesthesia Plan Comments:        Anesthesia Quick Evaluation

## 2012-10-04 LAB — POCT I-STAT 4, (NA,K, GLUC, HGB,HCT)
Glucose, Bld: 116 mg/dL — ABNORMAL HIGH (ref 70–99)
HCT: 26 % — ABNORMAL LOW (ref 39.0–52.0)
Hemoglobin: 8.8 g/dL — ABNORMAL LOW (ref 13.0–17.0)
Potassium: 3.8 mEq/L (ref 3.5–5.1)
Sodium: 142 mEq/L (ref 135–145)

## 2012-10-04 MED FILL — Sodium Chloride IV Soln 0.9%: INTRAVENOUS | Qty: 1000 | Status: AC

## 2012-10-04 MED FILL — Heparin Sodium (Porcine) Inj 1000 Unit/ML: INTRAMUSCULAR | Qty: 30 | Status: AC

## 2012-10-04 MED FILL — Sodium Chloride Irrigation Soln 0.9%: Qty: 3000 | Status: AC

## 2012-10-04 NOTE — Progress Notes (Signed)
Pt unable to void. Bladder scan = 999. Foley replaced per MD order. Holli Humbles, RN

## 2012-10-04 NOTE — Evaluation (Signed)
Physical Therapy Evaluation Patient Details Name: OTIE KALLENBACH MRN: [id number] DOB: [birth year] Today's Date: 10/04/2012 Time: [id number] PT Time Calculation (min): 22 min  PT Assessment / Plan / Recommendation Clinical Impression  79 y.o. male admitted to [facility] s/p  Lumbar One through Four Laminectomies and Lumbar One-Four Posterolateral Arthrodesis; No interbody placement; Hardware Only.  He presents POD #1 with decreased right leg strength, decreased mobility, and increased surgical pain at incision only not into his legs.  He has no reports of numbness or tingling into legs and is moving better every time he gets up to walk.  His legs do fatigue quickly and his right knee stays in hyperextension during stance phase of gait.  He would benefit from SNF for rehab due to the fact that he lives alone and will need to be mod I before going back to his home.      PT Assessment  Patient needs continued PT services    Follow Up Recommendations  SNF ([place] per RN)    Does the patient have the potential to tolerate intense rehabilitation     yes  Barriers to Discharge Decreased caregiver support no 24/7 assist at home, pt lives alone    Equipment Recommendations  None recommended by PT    Recommendations for Other Services   none  Frequency Min 5X/week    Precautions / Restrictions Precautions Precautions: Fall;Back;Posterior Hip Precaution Comments: verbally reviewed precautions, brace use with pt and his son.  H/o bil TKA and left THA (posterior) with dislocation x 5 most recent one was over a year ago Required Braces or Orthoses: Spinal Brace Spinal Brace: Lumbar corset Restrictions Weight Bearing Restrictions: No   Pertinent Vitals/Pain Pain 5/10 at incision site with gait.  RN aware, pre-medicated, and repositioned in recliner chair at end of session     Mobility  Bed Mobility Bed Mobility:  (RN and tech already had pt standing when PT entered room) Transfers Transfers:  Sit to Stand;Stand to [employer] Transfers Sit to Stand: 3: Mod assist;With upper extremity assist;With armrests;From chair/3-in-1 Stand to Sit: 4: Min assist;With upper extremity assist;With armrests;To elevated surface;To chair/3-in-1 Stand Pivot Transfers: 4: Min assist;With armrests Details for Transfer Assistance: mod assist to stand from lower WC to support trunk during transition of hands to get to RW.  Min assist to help pt steer RW while turning and steady his trunk for balance and min assist to help pt control descent to sit.  Pt needed verbal cues for safe hand placement and safe RW use during mobility.   Ambulation/Gait Ambulation/Gait Assistance: 4: Min assist Ambulation Distance (Feet): 55 Feet Assistive device: Rolling walker Ambulation/Gait Assistance Details: min assist to steady pt for balance over right weak leg which was going into hyperext during stance.  Min verbal cues to stay closer to RW.   Gait Pattern: Step-through pattern;Shuffle;Trunk flexed Gait velocity: less than 1.8 ft/sec which puts him at risk for recurrent falls        PT Diagnosis: Difficulty walking;Abnormality of gait;Generalized weakness;Acute pain  PT Problem List: Decreased strength;Decreased activity tolerance;Decreased balance;Decreased mobility;Decreased knowledge of use of DME;Decreased knowledge of precautions;Pain PT Treatment Interventions: DME instruction;Gait training;Stair training;Functional mobility training;Therapeutic activities;Therapeutic exercise;Balance training;Neuromuscular re-education;Patient/family education   PT Goals Acute Rehab PT Goals PT Goal Formulation: With patient Time For Goal Achievement: 10/11/12 Potential to Achieve Goals: Good Pt will Roll Supine to Right Side: with modified independence;with rail PT Goal: Rolling Supine to Right Side - Progress: Goal set today Pt  will Roll Supine to Left Side: with modified independence;with rail PT Goal: Rolling Supine to  Left Side - Progress: Goal set today Pt will go Supine/Side to Sit: with HOB 0 degrees;with supervision PT Goal: Supine/Side to Sit - Progress: Goal set today Pt will go Sit to Supine/Side: with min assist;with HOB 0 degrees PT Goal: Sit to Supine/Side - Progress: Goal set today Pt will go Sit to Stand: with supervision;with upper extremity assist PT Goal: Sit to Stand - Progress: Goal set today Pt will go Stand to Sit: with supervision;with upper extremity assist PT Goal: Stand to Sit - Progress: Goal set today Pt will Transfer Bed to Chair/Chair to Bed: with supervision PT Transfer Goal: Bed to Chair/Chair to Bed - Progress: Goal set today Pt will Ambulate: >150 feet;with supervision;with least restrictive assistive device PT Goal: Ambulate - Progress: Goal set today Additional Goals Additional Goal #1: pt will demonstrate and independently report 3/3 back precautions during his treatment session.   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     Prior Functioning  Home Living Lives With: Alone Available Help at Discharge: Family;Available PRN/intermittently Type of Home: House Home Access: Ramped entrance Home Layout: One level Bathroom Shower/Tub: Walk-in shower;Other (comment) (being built while he is away at [facility] for rehab) Bathroom Toilet: Standard Home Adaptive Equipment: Straight cane;Walker - rolling Additional Comments: pt was using bil canes one in each hand PTA, had falls due to right leg giving away on him.   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 Communication Communication: No  difficulties Dominant Hand: Left    Cognition  Cognition Overall Cognitive Status: Appears within functional limits for tasks assessed/performed Arousal/Alertness: Awake/alert Orientation Level: Appears intact for tasks assessed Behavior During Session: [facility] for tasks performed Cognition - Other Comments: not specifically tested, but general conversation WNL    Extremity/Trunk Assessment Right Lower Extremity Assessment RLE ROM/Strength/Tone: Deficits RLE ROM/Strength/Tone Deficits: functionally right knee 3-/5 per hyper ext in WB on right leg during gait.   RLE Sensation: WFL - Light Touch Left Lower Extremity Assessment LLE ROM/Strength/Tone: Deficits LLE ROM/Strength/Tone Deficits: 4/5 LLE Sensation: WFL - Light Touch      End of Session PT - End of Session Equipment Utilized During Treatment: Back brace Activity Tolerance: Patient limited by fatigue;Patient limited by pain Patient left: in chair;with call bell/phone within reach;with family/visitor present Nurse Communication:  (RN present during treatment session)    Wells Guiles B. Hutton, [place], DPT [phone]   10/04/2012, 11:21 AM

## 2012-10-04 NOTE — Clinical Social Work Placement (Addendum)
Clinical Social Work Department  CLINICAL SOCIAL WORK PLACEMENT NOTE  07/08/2012  Patient: RAD SHROFF Account Number: [id number] Admit date: 10/03/2012  Clinical Social Worker: Rhea Pink MSW Date/time: 10/04/2012 1322 PM  Clinical Social Work is seeking post-discharge placement for this patient at the following level of care: SKILLED NURSING (*CSW will update this form in Epic as items are completed)  3/6/2014Patient/family provided with [place] Department of Clinical Social Work's list of facilities offering this level of care within the geographic area requested by the patient (or if unable, by the patient's family).  10/04/2012 Patient/family informed of their freedom to choose among providers that offer the needed level of care, that participate in Medicare, Medicaid or managed care program needed by the patient, have an available bed and are willing to accept the patient.  10/04/2012 Patient/family informed of [facility]' ownership interest in [facility], as well as of the fact that they are under no obligation to receive care at this facility.  PASARR submitted to EDS on existing  PASARR number received from EDS on  FL2 transmitted to all facilities in geographic area requested by pt/family on 10/04/2012  FL2 transmitted to all facilities within larger geographic area on  Patient informed that his/her managed care company has contracts with or will negotiate with certain facilities, including the following:  Patient/family informed of bed offers received:  Patient chooses bed at [facility]) Physician recommends and patient chooses bed at SNF Patient to be transferred to on 10/06/12 (JB) Patient to be transferred to facility by [facility]) The following physician request were entered in Epic:  Additional Comments:  Rhea Pink, MSW, [phone]

## 2012-10-04 NOTE — Progress Notes (Signed)
Filed Vitals:   10/04/12 0000 10/04/12 0400 10/04/12 0757 10/04/12 1100  BP: 147/83 133/62 148/85 133/82  Pulse: 89 81 86 79  Temp: 98.6 F (37 C) 98.7 F (37.1 C) 99 F (37.2 C) 99.7 F (37.6 C)  TempSrc:      Resp: 20 18 18 16   SpO2: 98% 96% 96% 94%    CBC  Recent Labs  10/02/12 1146 10/03/12 1514  WBC 8.0  --   HGB 12.9* 8.8*  HCT 38.2* 26.0*  PLT 192  --    BMET  Recent Labs  10/02/12 1146 10/03/12 1514  NA 145 142  K 4.3 3.8  CL 106  --   CO2 29  --   GLUCOSE 85 116*  BUN 22  --   CREATININE 1.29  --   CALCIUM 10.9*  --     Patient sitting up in chair, has been ambulating limited distances in the hall. Dressing clean and dry. PT and OT consulted. Patient and his son and his daughter in law have requested referral to [facility] place for rehabilitation following discharge, prior to returning home. Social work has been consulted. No void yet.  Plan: Continue progress through postoperative recovery, with PT and OT. We'll have patient again attempt to avoid, it is unable to do so, staff is going to check bladder volume with a bladder scan. If he does avoid staff will check PVR with a bladder scan.  Hosie Spangle, MD 10/04/2012, 12:04 PM

## 2012-10-04 NOTE — Progress Notes (Signed)
UR COMPLETED  

## 2012-10-05 MED ORDER — HYDROCODONE-ACETAMINOPHEN 5-325 MG PO TABS: 0.5000 | ORAL_TABLET | ORAL | Status: DC | PRN

## 2012-10-05 NOTE — Evaluation (Signed)
Occupational Therapy Evaluation Patient Details Name: Nathaniel [name] MRN: [id number] DOB: [birth year] Today's Date: 10/05/2012 Time: [id number] OT Time Calculation (min): 24 min  OT Assessment / Plan / Recommendation Clinical Impression  79 yo male s/p L 1-4 laminectomies that could benefit from skilled OT acutely. Recommend SNf for d/c planning ([facility] per patient request)    OT Assessment  Patient needs continued OT Services    Follow Up Recommendations  SNF    Barriers to Discharge Decreased caregiver support    Equipment Recommendations  3 in 1 bedside comode    Recommendations for Other Services    Frequency  Min 2X/week    Precautions / Restrictions Precautions Precautions: Fall;Back;Posterior Hip Precaution Comments: educated on back and hip posterior precautions . pt has dislocated hip x5 (most recent 1 yr ago)  Required Braces or Orthoses: Spinal Brace Spinal Brace: Lumbar corset Restrictions Weight Bearing Restrictions: No   Pertinent Vitals/Pain 3 out 10    ADL  Lower Body Dressing: Set up Where Assessed - Lower Body Dressing: Unsupported sitting (using AE) Toilet Transfer: Min Psychiatric nurse Method: Sit to Loss adjuster, chartered: Raised toilet seat with arms (or 3-in-1 over toilet) Equipment Used: Back brace;Rolling walker Transfers/Ambulation Related to ADLs: Pt ambulates min guard (A) with RW. Pt fatigues quickly and ambulates ~50 ft ADL Comments: Pt educated on back precautions with ADLs. Pt educated on the use of AE for LB dressing and informed that SNF will provided AE. Pt and family educated on posterior hip precautions to prevent hip dislocation.    OT Diagnosis: Generalized weakness;Acute pain  OT Problem List: Decreased strength;Decreased activity tolerance;Impaired balance (sitting and/or standing);Decreased safety awareness;Decreased knowledge of use of DME or AE;Decreased knowledge of precautions;Pain OT Treatment  Interventions: Self-care/ADL training;DME and/or AE instruction;Therapeutic activities;Patient/family education;Balance training   OT Goals Acute Rehab OT Goals OT Goal Formulation: With patient/family Time For Goal Achievement: 10/19/12 Potential to Achieve Goals: Good ADL Goals Pt Will Perform Lower Body Bathing: with modified independence;Sit to stand from chair;with adaptive equipment ADL Goal: Lower Body Bathing - Progress: Goal set today Pt Will Perform Lower Body Dressing: with set-up;Sit to stand from chair;with adaptive equipment ADL Goal: Lower Body Dressing - Progress: Goal set today Pt Will Transfer to Toilet: with supervision;Ambulation;3-in-1 ADL Goal: Toilet Transfer - Progress: Goal set today Miscellaneous OT Goals Miscellaneous OT Goal #1: Pt will complete bed mobility Supervision level and maintain back precautions OT Goal: Miscellaneous Goal #1 - Progress: Goal set today  Visit Information  Last OT Received On: 10/05/12 Assistance Needed: +1    Subjective Data  Subjective: "I dont have hip precautions" Patient Stated Goal: to get back to riding a tractor   Prior [place] Lives With: Alone Available Help at Discharge: Family;Available PRN/intermittently Type of Home: House Home Access: Ramped entrance Home Layout: One level Bathroom Shower/Tub: Walk-in shower;Other (comment) Bathroom Toilet: Standard Home Adaptive Equipment: Straight cane;Walker - rolling Additional Comments: pt was using bil canes one in each hand PTA, had falls due to right leg giving away on him.   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 Pt at end of session able to recall 2 out 3 back precautions using teach back    Extremity/Trunk Assessment Right Upper Extremity Assessment RUE ROM/Strength/Tone: Within functional levels;Due to precautions RUE Sensation: WFL - Light Touch RUE Coordination: WFL - gross/fine motor Left Upper Extremity Assessment LUE ROM/Strength/Tone: Within functional levels;Due to precautions LUE Sensation: WFL - Light Touch LUE Coordination: WFL - gross/fine motor     Mobility Bed Mobility Bed Mobility: Not assessed Transfers Sit to Stand: 4: Min guard;With upper extremity assist;From chair/3-in-1 Stand to Sit: 4: Min guard;With upper extremity assist;To chair/3-in-1 Details for Transfer Assistance: pt with good hand placement and progressing quickly     Exercise     Balance Balance Balance Assessed: Yes Static Standing Balance Static Standing - Balance Support: Bilateral upper extremity supported;During functional activity Static Standing - Level of Assistance: 5: Stand by assistance   End of Session OT - End of Session Activity Tolerance: Patient tolerated treatment well Patient left: in chair;with call bell/phone within reach Nurse Communication: Mobility status;Precautions  GO     Veneda Melter 10/05/2012, 10:55 AM Pager: [phone]

## 2012-10-05 NOTE — Discharge Summary (Signed)
Physician Discharge Summary  Patient ID: Nathaniel [name] MRN: [id number] DOB/AGE: 03-19-1930 79 y.o.  Admit date: 10/03/2012 Discharge date: 10/06/12 (anticipated)   Admission Diagnoses:  Lumbar stenosis, lumbar spondylolisthesis, lumbar scoliosis, lumbar spondylosis, lumbar degenerative disc disease  Discharge Diagnoses:  Lumbar stenosis, lumbar spondylolisthesis, lumbar scoliosis, lumbar spondylosis, lumbar degenerative disc disease, postoperative urinary retention  Discharged Condition: good  Hospital Course: Patient was admitted for treatment of severe multilevel lumbar stenosis, spondylolisthesis, and scoliosis secondary to spondylosis and degenerative disc disease. Symptomatically he had difficulties with back pain and progressive weakness of the distal extremities, associated with imbalance. He used a rolling walker at home or held onto the surrounding furniture. He was taken to surgery and underwent an L1-L4 decompressive lumbar laminectomy and a L1-L4 posterior lateral arthrodesis with posterior instrumentation and bone graft.  Postoperatively he has been able to mobilize with assistance. He donned and doffed his lumbar brace standing up and out of bed. He ambulates with a rolling walker. His Foley catheter was discontinued, but he had significant urinary retention, and despite already being on Flomax (prescribed by his primary physician Dr. Burnard Bunting) a Foley catheter had to be placed again, and it is being left to straight drainage. The patient's family is going to arrange for him to followup this coming week with Dr. Rana Snare, at [facility] urology, (the patient's son is under the care of Dr. Risa Grill).  The patient and his family have asked him to be transferred to a SNF ([facility]) for postoperative rehabilitation. Patient was seen in consultation by PT and OT who have continue to work with him throughout the postoperative period.  The case management service has made arrangements  for admission to [facility].  The patient as needed little in the way of pain medication. He will most likely be able to be managed with by mouth Tylenol, but we have given him a prescription for a small amount of Norco 5/325.  The dressing has been removed, the wound is healing nicely, and is to be left open to air, I have asked the patient to shower daily, and allow the shower water to run on the incision each day for at least 10 minutes. It is not be covered for showers. The glue on incision should be removed after 2 weeks.  He has been encouraged to ambulate at least 5-6 times per day in the hallways, with assistance is needed.  He is to return for followup with me in the office in about 3 weeks, with x-rays at the office.  Discharge Exam: Blood pressure 149/83, pulse 95, temperature 99.1 F (37.3 C), temperature source Oral, resp. rate 16, SpO2 92.00%.  Disposition: SNF      Medication List    STOP taking these medications       HYDROcodone-acetaminophen 10-325 MG per tablet  Commonly known as:  NORCO      TAKE these medications       furosemide 20 MG tablet  Commonly known as:  LASIX  Take 20 mg by mouth every evening.     HYDROcodone-acetaminophen 5-325 MG per tablet  Commonly known as:  NORCO/VICODIN  Take 0.5-1 tablets by mouth every 4 (four) hours as needed.     tamsulosin 0.4 MG Caps  Commonly known as:  FLOMAX  Take 0.4 mg by mouth daily after breakfast.     valsartan 320 MG tablet  Commonly known as:  DIOVAN  Take 160 mg by mouth daily.  Signed: Hosie Spangle, MD 10/05/2012, 2:24 PM

## 2012-10-05 NOTE — Clinical Social Work Psychosocial (Addendum)
Clinical Social Work Department  BRIEF PSYCHOSOCIAL ASSESSMENT  Patient: Nathaniel [name]  Account Number: [id number]  Admit date: 10/03/2012 Clinical Social Worker Rhea Pink, MSW Date/Time: 10/05/2012 Referred by: Physician Date Referred: 10/04/12 Referred for   SNF Placement   Other Referral:  Interview type: Patient and patient's son Dathan Banaszewski Other interview type: PSYCHOSOCIAL DATA  Living Status: Alone Admitted from facility:  Level of care:  Primary support name: Dow Adolph  Primary support relationship to patient: son Degree of support available:  Strong and vested  CURRENT CONCERNS  Current Concerns   Post-Acute Placement   Other Concerns:  SOCIAL WORK ASSESSMENT / PLAN  CSW met with pt re: PT recommendation for SNF.   Pt lives alone   CSW explained placement process and answered questions.   Pt reports the he pre-registered at [place] completed FL2 and sent clinicals to [facility]     Assessment/plan status: Information/Referral to [employer]  Other assessment/ plan:  Information/referral to community resources:  SNF   PTAR   PATIENT'S/FAMILY'S RESPONSE TO PLAN OF CARE:  Pt  And patient's son report they are agreeable to [facility] SNF in order to increase strength and independence with mobility prior to return home  Pt verbalized understanding of placement process and appreciation for CSW assist.   Rhea Pink, MSW [phone]

## 2012-10-05 NOTE — Progress Notes (Signed)
Physical Therapy Treatment Patient Details Name: Nathaniel [name] MRN: [id number] DOB: [birth year] Today's Date: 10/05/2012 Time: [id number] PT Time Calculation (min): 20 min  PT Assessment / Plan / Recommendation Comments on Treatment Session  Pt s/p lumbar surgery.  Making slow, steady progress.    Follow Up Recommendations  SNF     Does the patient have the potential to tolerate intense rehabilitation     Barriers to Discharge        Equipment Recommendations  None recommended by PT    Recommendations for Other Services    Frequency Min 5X/week   Plan Discharge plan remains appropriate;Frequency remains appropriate    Precautions / Restrictions Precautions Precautions: Fall;Back;Posterior Hip Precaution Comments: educated on back and hip posterior precautions . pt has dislocated hip x5 (most recent 1 yr ago)  Required Braces or Orthoses: Spinal Brace Spinal Brace: Lumbar corset   Pertinent Vitals/Pain See flow sheet    Mobility  Bed Mobility Bed Mobility: Sit to Sidelying Right Sit to Sidelying Right: 4: Min assist Details for Bed Mobility Assistance: assist with lower extremities Transfers Sit to Stand: 4: Min guard;With upper extremity assist;From chair/3-in-1 Stand to Sit: 4: Min guard;With upper extremity assist;To bed Details for Transfer Assistance: verbal cues to scoot forward on chair before coming to stand Ambulation/Gait Ambulation/Gait Assistance: 4: Min assist Ambulation Distance (Feet): 100 Feet Assistive device: Rolling walker Ambulation/Gait Assistance Details: Assist due to weakness in rt leg with hyperextension especially as distance incr Gait Pattern: Right genu recurvatum;Step-through pattern;Decreased step length - left;Decreased step length - right;Trunk flexed    Exercises     PT Diagnosis:    PT Problem List:   PT Treatment Interventions:     PT Goals Acute Rehab PT Goals PT Goal: Sit to Supine/Side - Progress: Progressing toward goal PT  Goal: Sit to Stand - Progress: Progressing toward goal PT Goal: Stand to Sit - Progress: Progressing toward goal PT Goal: Ambulate - Progress: Progressing toward goal Additional Goals PT Goal: Additional Goal #1 - Progress: Progressing toward goal  Visit Information  Last PT Received On: 10/05/12 Assistance Needed: +1    Subjective Data  Subjective: Pt states it felt good to have a shower.   Cognition  Cognition Overall Cognitive Status: Appears within functional limits for tasks assessed/performed Arousal/Alertness: Awake/alert Orientation Level: Appears intact for tasks assessed Behavior During Session: [facility] for tasks performed Cognition - Other Comments: pt unable to recall any hip precautions and 1 out 3 back precautions. Pt at end of session able to recall 2 out 3 back precautions using teach back    Balance  Balance Balance Assessed: Yes Static Standing Balance Static Standing - Balance Support: Bilateral upper extremity supported;During functional activity Static Standing - Level of Assistance: 4: Min assist  End of Session PT - End of Session Equipment Utilized During Treatment: Back brace;Gait belt Activity Tolerance: Patient limited by fatigue Patient left: in bed;with call bell/phone within reach Nurse Communication: Mobility status   GP     Nathaniel [name] 10/05/2012, 1:56 PM  [employer] PT [phone]

## 2012-10-06 DIAGNOSIS — M4150 Other secondary scoliosis, site unspecified: Secondary | ICD-10-CM | POA: Diagnosis not present

## 2012-10-06 DIAGNOSIS — M48061 Spinal stenosis, lumbar region without neurogenic claudication: Secondary | ICD-10-CM | POA: Diagnosis not present

## 2012-10-06 DIAGNOSIS — N138 Other obstructive and reflux uropathy: Secondary | ICD-10-CM | POA: Diagnosis not present

## 2012-10-06 DIAGNOSIS — M159 Polyosteoarthritis, unspecified: Secondary | ICD-10-CM | POA: Diagnosis not present

## 2012-10-06 DIAGNOSIS — I10 Essential (primary) hypertension: Secondary | ICD-10-CM | POA: Diagnosis not present

## 2012-10-06 DIAGNOSIS — M5137 Other intervertebral disc degeneration, lumbosacral region: Secondary | ICD-10-CM | POA: Diagnosis not present

## 2012-10-06 DIAGNOSIS — R279 Unspecified lack of coordination: Secondary | ICD-10-CM | POA: Diagnosis not present

## 2012-10-06 DIAGNOSIS — R269 Unspecified abnormalities of gait and mobility: Secondary | ICD-10-CM | POA: Diagnosis not present

## 2012-10-06 DIAGNOSIS — M6281 Muscle weakness (generalized): Secondary | ICD-10-CM | POA: Diagnosis not present

## 2012-10-06 DIAGNOSIS — Z4889 Encounter for other specified surgical aftercare: Secondary | ICD-10-CM | POA: Diagnosis not present

## 2012-10-06 DIAGNOSIS — R339 Retention of urine, unspecified: Secondary | ICD-10-CM | POA: Diagnosis not present

## 2012-10-06 DIAGNOSIS — N390 Urinary tract infection, site not specified: Secondary | ICD-10-CM | POA: Diagnosis not present

## 2012-10-06 DIAGNOSIS — N189 Chronic kidney disease, unspecified: Secondary | ICD-10-CM | POA: Diagnosis not present

## 2012-10-06 DIAGNOSIS — G8929 Other chronic pain: Secondary | ICD-10-CM | POA: Diagnosis not present

## 2012-10-06 DIAGNOSIS — N400 Enlarged prostate without lower urinary tract symptoms: Secondary | ICD-10-CM | POA: Diagnosis not present

## 2012-10-06 DIAGNOSIS — M47817 Spondylosis without myelopathy or radiculopathy, lumbosacral region: Secondary | ICD-10-CM | POA: Diagnosis not present

## 2012-10-06 DIAGNOSIS — R509 Fever, unspecified: Secondary | ICD-10-CM | POA: Diagnosis not present

## 2012-10-06 NOTE — Progress Notes (Signed)
Filed Vitals:   10/05/12 2000 10/06/12 0000 10/06/12 0409 10/06/12 0808  BP: 174/91 116/67 150/84 141/74  Pulse: 99 91 81 71  Temp: 99.7 F (37.6 C) 99.9 F (37.7 C) 99.2 F (37.3 C) 99.3 F (37.4 C)  TempSrc:      Resp: 18 18 18 18   SpO2: 95% 96% 95% 92%    CBC  Recent Labs  10/03/12 1514  HGB 8.8*  HCT 26.0*   BMET  Recent Labs  10/03/12 1514  NA 142  K 3.8  GLUCOSE 116*    Patient resting in bed, without complaints. Has been up and ambulating in the halls. Wound healing well. For transfer to SNF today.  Plan: Transferred to SNF.  Hosie Spangle, MD 10/06/2012, 8:09 AM

## 2012-10-06 NOTE — Clinical Social Work Note (Signed)
Pt to transfer to [facility] today via family transport. Pt, pt's family, and snf aware of d/c. D/C packet complete with signed fl2, chart copy, and signed hard rx. CSW signing off as no other CSW needs identified at this time.  Wandra Feinstein, MSW, [place] (Weekends 8:00am-4:30pm)

## 2012-10-06 NOTE — Progress Notes (Addendum)
Pt and son given D/C instructions. Packet given to son to carry to [facility]. Taught both Pt and son how to change Foley & leg bag, verbal understanding given. Attempted to call [facility], phone lines down due to strom, informed son to tell them to call with any questions. Pt D/C'd to SNF via wheelchair with family per MD order. Holli Humbles, RN

## 2012-10-09 DIAGNOSIS — N138 Other obstructive and reflux uropathy: Secondary | ICD-10-CM | POA: Diagnosis not present

## 2012-10-22 DIAGNOSIS — N401 Enlarged prostate with lower urinary tract symptoms: Secondary | ICD-10-CM | POA: Diagnosis not present

## 2012-10-22 DIAGNOSIS — N138 Other obstructive and reflux uropathy: Secondary | ICD-10-CM | POA: Diagnosis not present

## 2012-10-23 ENCOUNTER — Non-Acute Institutional Stay (SKILLED_NURSING_FACILITY): Payer: Medicare Other | Admitting: Nurse Practitioner

## 2012-10-23 DIAGNOSIS — G8929 Other chronic pain: Secondary | ICD-10-CM | POA: Diagnosis not present

## 2012-10-23 DIAGNOSIS — R269 Unspecified abnormalities of gait and mobility: Secondary | ICD-10-CM | POA: Diagnosis not present

## 2012-10-23 DIAGNOSIS — R509 Fever, unspecified: Secondary | ICD-10-CM | POA: Diagnosis not present

## 2012-10-23 DIAGNOSIS — R339 Retention of urine, unspecified: Secondary | ICD-10-CM

## 2012-10-23 DIAGNOSIS — I10 Essential (primary) hypertension: Secondary | ICD-10-CM | POA: Diagnosis not present

## 2012-10-23 DIAGNOSIS — N400 Enlarged prostate without lower urinary tract symptoms: Secondary | ICD-10-CM | POA: Diagnosis not present

## 2012-10-25 DIAGNOSIS — M48061 Spinal stenosis, lumbar region without neurogenic claudication: Secondary | ICD-10-CM | POA: Diagnosis not present

## 2012-10-29 ENCOUNTER — Encounter: Payer: Self-pay | Admitting: Nurse Practitioner

## 2012-10-29 MED ORDER — CEFTRIAXONE SODIUM 1 G IJ SOLR: 1.0000 g | INTRAMUSCULAR | Status: AC

## 2012-10-29 NOTE — Progress Notes (Signed)
Subjective:     Patient ID: Nathaniel [name], male   DOB: 02-05-1930, 79 y.o.   MRN: [id number]  HPI Comments: He is here s/p L1-L4 decompressive Laminectomy earlier this month. Pt had post-op urinary retention while inpatient and foley catheter was left in place. He was seen today at Dr. Cy Blamer office - urology, and his foley catheter was removed. He returned to facility and c/o frequent urination of small amounts of urine with urgency. He is taking Flomax 0.4mg  qd. He then developed fever of 102.6 with chills.  Fever  This is a new problem. The current episode started today. The maximum temperature noted was 102 to 102.9 F. The temperature was taken using an oral thermometer. Associated symptoms include urinary pain. Pertinent negatives include no abdominal pain, chest pain, congestion, coughing, diarrhea, ear pain, headaches, muscle aches, rash, sleepiness, sore throat, vomiting or wheezing. He has tried nothing for the symptoms.     Review of Systems  Constitutional: Positive for fever and chills.  HENT: Negative for ear pain, congestion and sore throat.   Respiratory: Negative for cough and wheezing.   Cardiovascular: Negative for chest pain.  Gastrointestinal: Negative for vomiting, abdominal pain and diarrhea.  Genitourinary: Positive for urgency, frequency, decreased urine volume and difficulty urinating.  Skin: Negative for rash.  Neurological: Negative for headaches.       Objective:   Physical Exam  Constitutional: He appears distressed.  Eyes: Pupils are equal, round, and reactive to light.  Cardiovascular: Normal rate and regular rhythm.   Pulmonary/Chest: Effort normal and breath sounds normal.  Genitourinary:  + bladder distention with tenderness on palpation.  Neurological: He is alert.  Skin: He is not diaphoretic.       Assessment:   Pt seen today for c/o fever of 102.6, chills, and urinary retention, s/p foley removal at Dr. Cy Blamer office.    Plan:  1)   Re-insert foley catheter.   2)Obtain ua c&s to r/o uti.  3) Rocephin 1gm IM x 1 now, can continue Rocephin 1gm IM qd x 7 days empirically until urine results return. * may mix doses with Lidocaine.

## 2012-11-06 DIAGNOSIS — N390 Urinary tract infection, site not specified: Secondary | ICD-10-CM | POA: Diagnosis not present

## 2012-11-06 DIAGNOSIS — N401 Enlarged prostate with lower urinary tract symptoms: Secondary | ICD-10-CM | POA: Diagnosis not present

## 2012-11-06 DIAGNOSIS — N138 Other obstructive and reflux uropathy: Secondary | ICD-10-CM | POA: Diagnosis not present

## 2012-11-09 ENCOUNTER — Non-Acute Institutional Stay (SKILLED_NURSING_FACILITY): Payer: Medicare Other | Admitting: Nurse Practitioner

## 2012-11-09 ENCOUNTER — Encounter: Payer: Self-pay | Admitting: Nurse Practitioner

## 2012-11-09 DIAGNOSIS — G8929 Other chronic pain: Secondary | ICD-10-CM | POA: Diagnosis not present

## 2012-11-09 DIAGNOSIS — R269 Unspecified abnormalities of gait and mobility: Secondary | ICD-10-CM | POA: Diagnosis not present

## 2012-11-09 DIAGNOSIS — N400 Enlarged prostate without lower urinary tract symptoms: Secondary | ICD-10-CM | POA: Diagnosis not present

## 2012-11-09 DIAGNOSIS — R509 Fever, unspecified: Secondary | ICD-10-CM | POA: Diagnosis not present

## 2012-11-09 DIAGNOSIS — I10 Essential (primary) hypertension: Secondary | ICD-10-CM | POA: Diagnosis not present

## 2012-11-09 DIAGNOSIS — R339 Retention of urine, unspecified: Secondary | ICD-10-CM | POA: Diagnosis not present

## 2012-11-09 MED ORDER — FUROSEMIDE 20 MG PO TABS: 20.0000 mg | ORAL_TABLET | ORAL | Status: AC

## 2012-11-09 MED ORDER — HYDROCODONE-ACETAMINOPHEN 5-325 MG PO TABS: 1.0000 | ORAL_TABLET | Freq: Three times a day (TID) | ORAL | Status: AC | PRN

## 2012-11-09 MED ORDER — LOSARTAN POTASSIUM 100 MG PO TABS: 100.0000 mg | ORAL_TABLET | Freq: Every day | ORAL | Status: AC

## 2012-11-09 MED ORDER — TAMSULOSIN HCL 0.4 MG PO CAPS: 0.4000 mg | ORAL_CAPSULE | Freq: Every day | ORAL | Status: AC

## 2012-11-09 NOTE — Progress Notes (Signed)
  Subjective:    Patient ID: Nathaniel [name], male    DOB: 1930-03-18, 79 y.o.   MRN: [id number]  HPI Comments: Pt was seen today for discharge from [place]. He is s/p lumbar laminectomy. He is alert, sitting up in chair with lumbar brace on. His family is at the bedside. He ws seen by Dr. Risa Grill urologist on 11/06/12, to follow up on urinary retention s/p uti. His foley catheter was removed. He has been stable w/o s/sx of dysuria.     Review of Systems  Constitutional: Negative.   HENT: Negative.   Eyes: Positive for discharge.  Respiratory: Negative.   Cardiovascular: Negative.   Gastrointestinal: Negative.   Endocrine: Negative.   Genitourinary: Negative.   Musculoskeletal: Negative.   Skin: Negative.   Allergic/Immunologic: Negative.   Neurological: Negative.   Hematological: Negative.   Psychiatric/Behavioral: Negative.        Objective:   Physical Exam  Constitutional: He is oriented to person, place, and time. No distress.  Eyes: Pupils are equal, round, and reactive to light.  Cardiovascular: Normal rate and regular rhythm.   Pulmonary/Chest: Effort normal and breath sounds normal.  Genitourinary:  No bladder tenderness or distention.  Musculoskeletal:  Has lumbar brace on while up in chair.  Neurological: He is alert and oriented to person, place, and time.  Skin: Skin is warm and dry. He is not diaphoretic.  Psychiatric: He has a normal mood and affect.       Assessment & Plan:  - Discharge home on 11/10/12 with home health pt/ot/RN - urinary retention, foley removed at urology. Follow up with Dr. Risa Grill  - urologist in 1 week, last seen on 11/06/12. Follow up with pcp in 1 -2 weeks.  - rx's were faxed to [facility] on [address] at [facility]. Vicodin 1/2 tab was dc'd. Pt was on Diovan per d/c summary. He was switched to Losartan for formulary sub here at facility. He and his family are ok with him staying on Losartan since he has been stable on it.

## 2012-11-12 DIAGNOSIS — I10 Essential (primary) hypertension: Secondary | ICD-10-CM | POA: Diagnosis not present

## 2012-11-12 DIAGNOSIS — Z4789 Encounter for other orthopedic aftercare: Secondary | ICD-10-CM | POA: Diagnosis not present

## 2012-11-12 DIAGNOSIS — M48061 Spinal stenosis, lumbar region without neurogenic claudication: Secondary | ICD-10-CM | POA: Diagnosis not present

## 2012-11-12 DIAGNOSIS — M4120 Other idiopathic scoliosis, site unspecified: Secondary | ICD-10-CM | POA: Diagnosis not present

## 2012-11-12 DIAGNOSIS — R269 Unspecified abnormalities of gait and mobility: Secondary | ICD-10-CM | POA: Diagnosis not present

## 2012-11-13 DIAGNOSIS — N390 Urinary tract infection, site not specified: Secondary | ICD-10-CM | POA: Diagnosis not present

## 2012-11-13 DIAGNOSIS — N401 Enlarged prostate with lower urinary tract symptoms: Secondary | ICD-10-CM | POA: Diagnosis not present

## 2012-11-13 DIAGNOSIS — N138 Other obstructive and reflux uropathy: Secondary | ICD-10-CM | POA: Diagnosis not present

## 2012-11-16 DIAGNOSIS — R269 Unspecified abnormalities of gait and mobility: Secondary | ICD-10-CM | POA: Diagnosis not present

## 2012-11-16 DIAGNOSIS — I10 Essential (primary) hypertension: Secondary | ICD-10-CM | POA: Diagnosis not present

## 2012-11-16 DIAGNOSIS — M48061 Spinal stenosis, lumbar region without neurogenic claudication: Secondary | ICD-10-CM | POA: Diagnosis not present

## 2012-11-16 DIAGNOSIS — M4120 Other idiopathic scoliosis, site unspecified: Secondary | ICD-10-CM | POA: Diagnosis not present

## 2012-11-16 DIAGNOSIS — Z4789 Encounter for other orthopedic aftercare: Secondary | ICD-10-CM | POA: Diagnosis not present

## 2012-11-20 DIAGNOSIS — Z4789 Encounter for other orthopedic aftercare: Secondary | ICD-10-CM | POA: Diagnosis not present

## 2012-11-20 DIAGNOSIS — M4120 Other idiopathic scoliosis, site unspecified: Secondary | ICD-10-CM | POA: Diagnosis not present

## 2012-11-20 DIAGNOSIS — R269 Unspecified abnormalities of gait and mobility: Secondary | ICD-10-CM | POA: Diagnosis not present

## 2012-11-20 DIAGNOSIS — M48061 Spinal stenosis, lumbar region without neurogenic claudication: Secondary | ICD-10-CM | POA: Diagnosis not present

## 2012-11-20 DIAGNOSIS — I10 Essential (primary) hypertension: Secondary | ICD-10-CM | POA: Diagnosis not present

## 2012-11-22 DIAGNOSIS — I10 Essential (primary) hypertension: Secondary | ICD-10-CM | POA: Diagnosis not present

## 2012-11-22 DIAGNOSIS — R269 Unspecified abnormalities of gait and mobility: Secondary | ICD-10-CM | POA: Diagnosis not present

## 2012-11-22 DIAGNOSIS — M48061 Spinal stenosis, lumbar region without neurogenic claudication: Secondary | ICD-10-CM | POA: Diagnosis not present

## 2012-11-22 DIAGNOSIS — M4120 Other idiopathic scoliosis, site unspecified: Secondary | ICD-10-CM | POA: Diagnosis not present

## 2012-11-22 DIAGNOSIS — Z4789 Encounter for other orthopedic aftercare: Secondary | ICD-10-CM | POA: Diagnosis not present

## 2012-11-23 DIAGNOSIS — R269 Unspecified abnormalities of gait and mobility: Secondary | ICD-10-CM | POA: Diagnosis not present

## 2012-11-23 DIAGNOSIS — I10 Essential (primary) hypertension: Secondary | ICD-10-CM | POA: Diagnosis not present

## 2012-11-23 DIAGNOSIS — M48061 Spinal stenosis, lumbar region without neurogenic claudication: Secondary | ICD-10-CM | POA: Diagnosis not present

## 2012-11-23 DIAGNOSIS — Z4789 Encounter for other orthopedic aftercare: Secondary | ICD-10-CM | POA: Diagnosis not present

## 2012-11-23 DIAGNOSIS — M4120 Other idiopathic scoliosis, site unspecified: Secondary | ICD-10-CM | POA: Diagnosis not present

## 2012-11-25 DIAGNOSIS — M48061 Spinal stenosis, lumbar region without neurogenic claudication: Secondary | ICD-10-CM | POA: Diagnosis not present

## 2012-11-25 DIAGNOSIS — I10 Essential (primary) hypertension: Secondary | ICD-10-CM | POA: Diagnosis not present

## 2012-11-25 DIAGNOSIS — Z4789 Encounter for other orthopedic aftercare: Secondary | ICD-10-CM | POA: Diagnosis not present

## 2012-11-25 DIAGNOSIS — R269 Unspecified abnormalities of gait and mobility: Secondary | ICD-10-CM | POA: Diagnosis not present

## 2012-11-25 DIAGNOSIS — M4120 Other idiopathic scoliosis, site unspecified: Secondary | ICD-10-CM | POA: Diagnosis not present

## 2012-11-26 DIAGNOSIS — Z4789 Encounter for other orthopedic aftercare: Secondary | ICD-10-CM | POA: Diagnosis not present

## 2012-11-26 DIAGNOSIS — I10 Essential (primary) hypertension: Secondary | ICD-10-CM | POA: Diagnosis not present

## 2012-11-26 DIAGNOSIS — M4120 Other idiopathic scoliosis, site unspecified: Secondary | ICD-10-CM | POA: Diagnosis not present

## 2012-11-26 DIAGNOSIS — M48061 Spinal stenosis, lumbar region without neurogenic claudication: Secondary | ICD-10-CM | POA: Diagnosis not present

## 2012-11-26 DIAGNOSIS — R269 Unspecified abnormalities of gait and mobility: Secondary | ICD-10-CM | POA: Diagnosis not present

## 2012-11-28 DIAGNOSIS — M48061 Spinal stenosis, lumbar region without neurogenic claudication: Secondary | ICD-10-CM | POA: Diagnosis not present

## 2012-11-28 DIAGNOSIS — N2581 Secondary hyperparathyroidism of renal origin: Secondary | ICD-10-CM | POA: Diagnosis not present

## 2012-11-28 DIAGNOSIS — I129 Hypertensive chronic kidney disease with stage 1 through stage 4 chronic kidney disease, or unspecified chronic kidney disease: Secondary | ICD-10-CM | POA: Diagnosis not present

## 2012-11-28 DIAGNOSIS — I10 Essential (primary) hypertension: Secondary | ICD-10-CM | POA: Diagnosis not present

## 2012-11-28 DIAGNOSIS — M4120 Other idiopathic scoliosis, site unspecified: Secondary | ICD-10-CM | POA: Diagnosis not present

## 2012-11-28 DIAGNOSIS — M1990 Unspecified osteoarthritis, unspecified site: Secondary | ICD-10-CM | POA: Diagnosis not present

## 2012-11-28 DIAGNOSIS — R269 Unspecified abnormalities of gait and mobility: Secondary | ICD-10-CM | POA: Diagnosis not present

## 2012-11-28 DIAGNOSIS — Z4789 Encounter for other orthopedic aftercare: Secondary | ICD-10-CM | POA: Diagnosis not present

## 2012-11-28 DIAGNOSIS — N184 Chronic kidney disease, stage 4 (severe): Secondary | ICD-10-CM | POA: Diagnosis not present

## 2012-11-29 DIAGNOSIS — Z4789 Encounter for other orthopedic aftercare: Secondary | ICD-10-CM | POA: Diagnosis not present

## 2012-11-29 DIAGNOSIS — M48061 Spinal stenosis, lumbar region without neurogenic claudication: Secondary | ICD-10-CM | POA: Diagnosis not present

## 2012-11-29 DIAGNOSIS — M4120 Other idiopathic scoliosis, site unspecified: Secondary | ICD-10-CM | POA: Diagnosis not present

## 2012-11-29 DIAGNOSIS — R269 Unspecified abnormalities of gait and mobility: Secondary | ICD-10-CM | POA: Diagnosis not present

## 2012-11-29 DIAGNOSIS — I10 Essential (primary) hypertension: Secondary | ICD-10-CM | POA: Diagnosis not present

## 2012-12-03 DIAGNOSIS — Z4789 Encounter for other orthopedic aftercare: Secondary | ICD-10-CM | POA: Diagnosis not present

## 2012-12-03 DIAGNOSIS — M48061 Spinal stenosis, lumbar region without neurogenic claudication: Secondary | ICD-10-CM | POA: Diagnosis not present

## 2012-12-03 DIAGNOSIS — I10 Essential (primary) hypertension: Secondary | ICD-10-CM | POA: Diagnosis not present

## 2012-12-03 DIAGNOSIS — M4120 Other idiopathic scoliosis, site unspecified: Secondary | ICD-10-CM | POA: Diagnosis not present

## 2012-12-03 DIAGNOSIS — R269 Unspecified abnormalities of gait and mobility: Secondary | ICD-10-CM | POA: Diagnosis not present

## 2012-12-05 DIAGNOSIS — R269 Unspecified abnormalities of gait and mobility: Secondary | ICD-10-CM | POA: Diagnosis not present

## 2012-12-05 DIAGNOSIS — M4120 Other idiopathic scoliosis, site unspecified: Secondary | ICD-10-CM | POA: Diagnosis not present

## 2012-12-05 DIAGNOSIS — Z4789 Encounter for other orthopedic aftercare: Secondary | ICD-10-CM | POA: Diagnosis not present

## 2012-12-05 DIAGNOSIS — I10 Essential (primary) hypertension: Secondary | ICD-10-CM | POA: Diagnosis not present

## 2012-12-05 DIAGNOSIS — M48061 Spinal stenosis, lumbar region without neurogenic claudication: Secondary | ICD-10-CM | POA: Diagnosis not present

## 2012-12-06 DIAGNOSIS — M4120 Other idiopathic scoliosis, site unspecified: Secondary | ICD-10-CM | POA: Diagnosis not present

## 2012-12-06 DIAGNOSIS — I10 Essential (primary) hypertension: Secondary | ICD-10-CM | POA: Diagnosis not present

## 2012-12-06 DIAGNOSIS — Z4789 Encounter for other orthopedic aftercare: Secondary | ICD-10-CM | POA: Diagnosis not present

## 2012-12-06 DIAGNOSIS — R269 Unspecified abnormalities of gait and mobility: Secondary | ICD-10-CM | POA: Diagnosis not present

## 2012-12-06 DIAGNOSIS — M48061 Spinal stenosis, lumbar region without neurogenic claudication: Secondary | ICD-10-CM | POA: Diagnosis not present

## 2012-12-07 DIAGNOSIS — M48061 Spinal stenosis, lumbar region without neurogenic claudication: Secondary | ICD-10-CM | POA: Diagnosis not present

## 2012-12-07 DIAGNOSIS — Z4789 Encounter for other orthopedic aftercare: Secondary | ICD-10-CM | POA: Diagnosis not present

## 2012-12-07 DIAGNOSIS — R269 Unspecified abnormalities of gait and mobility: Secondary | ICD-10-CM | POA: Diagnosis not present

## 2012-12-07 DIAGNOSIS — M4120 Other idiopathic scoliosis, site unspecified: Secondary | ICD-10-CM | POA: Diagnosis not present

## 2012-12-07 DIAGNOSIS — I10 Essential (primary) hypertension: Secondary | ICD-10-CM | POA: Diagnosis not present

## 2012-12-10 DIAGNOSIS — Z4789 Encounter for other orthopedic aftercare: Secondary | ICD-10-CM | POA: Diagnosis not present

## 2012-12-10 DIAGNOSIS — M4120 Other idiopathic scoliosis, site unspecified: Secondary | ICD-10-CM | POA: Diagnosis not present

## 2012-12-10 DIAGNOSIS — M48061 Spinal stenosis, lumbar region without neurogenic claudication: Secondary | ICD-10-CM | POA: Diagnosis not present

## 2012-12-10 DIAGNOSIS — I10 Essential (primary) hypertension: Secondary | ICD-10-CM | POA: Diagnosis not present

## 2012-12-10 DIAGNOSIS — R269 Unspecified abnormalities of gait and mobility: Secondary | ICD-10-CM | POA: Diagnosis not present

## 2012-12-11 DIAGNOSIS — M48061 Spinal stenosis, lumbar region without neurogenic claudication: Secondary | ICD-10-CM | POA: Diagnosis not present

## 2012-12-11 DIAGNOSIS — M4120 Other idiopathic scoliosis, site unspecified: Secondary | ICD-10-CM | POA: Diagnosis not present

## 2012-12-11 DIAGNOSIS — Z4789 Encounter for other orthopedic aftercare: Secondary | ICD-10-CM | POA: Diagnosis not present

## 2012-12-11 DIAGNOSIS — R269 Unspecified abnormalities of gait and mobility: Secondary | ICD-10-CM | POA: Diagnosis not present

## 2012-12-11 DIAGNOSIS — I10 Essential (primary) hypertension: Secondary | ICD-10-CM | POA: Diagnosis not present

## 2012-12-12 DIAGNOSIS — M4120 Other idiopathic scoliosis, site unspecified: Secondary | ICD-10-CM | POA: Diagnosis not present

## 2012-12-12 DIAGNOSIS — M48061 Spinal stenosis, lumbar region without neurogenic claudication: Secondary | ICD-10-CM | POA: Diagnosis not present

## 2012-12-12 DIAGNOSIS — I10 Essential (primary) hypertension: Secondary | ICD-10-CM | POA: Diagnosis not present

## 2012-12-12 DIAGNOSIS — Z4789 Encounter for other orthopedic aftercare: Secondary | ICD-10-CM | POA: Diagnosis not present

## 2012-12-12 DIAGNOSIS — R269 Unspecified abnormalities of gait and mobility: Secondary | ICD-10-CM | POA: Diagnosis not present

## 2012-12-14 DIAGNOSIS — M4120 Other idiopathic scoliosis, site unspecified: Secondary | ICD-10-CM | POA: Diagnosis not present

## 2012-12-14 DIAGNOSIS — M48061 Spinal stenosis, lumbar region without neurogenic claudication: Secondary | ICD-10-CM | POA: Diagnosis not present

## 2012-12-14 DIAGNOSIS — Z4789 Encounter for other orthopedic aftercare: Secondary | ICD-10-CM | POA: Diagnosis not present

## 2012-12-14 DIAGNOSIS — I10 Essential (primary) hypertension: Secondary | ICD-10-CM | POA: Diagnosis not present

## 2012-12-14 DIAGNOSIS — R269 Unspecified abnormalities of gait and mobility: Secondary | ICD-10-CM | POA: Diagnosis not present

## 2012-12-19 DIAGNOSIS — R269 Unspecified abnormalities of gait and mobility: Secondary | ICD-10-CM | POA: Diagnosis not present

## 2012-12-19 DIAGNOSIS — M4120 Other idiopathic scoliosis, site unspecified: Secondary | ICD-10-CM | POA: Diagnosis not present

## 2012-12-19 DIAGNOSIS — M48061 Spinal stenosis, lumbar region without neurogenic claudication: Secondary | ICD-10-CM | POA: Diagnosis not present

## 2012-12-19 DIAGNOSIS — Z4789 Encounter for other orthopedic aftercare: Secondary | ICD-10-CM | POA: Diagnosis not present

## 2012-12-19 DIAGNOSIS — I10 Essential (primary) hypertension: Secondary | ICD-10-CM | POA: Diagnosis not present

## 2012-12-20 DIAGNOSIS — M4120 Other idiopathic scoliosis, site unspecified: Secondary | ICD-10-CM | POA: Diagnosis not present

## 2012-12-20 DIAGNOSIS — R269 Unspecified abnormalities of gait and mobility: Secondary | ICD-10-CM | POA: Diagnosis not present

## 2012-12-20 DIAGNOSIS — M48061 Spinal stenosis, lumbar region without neurogenic claudication: Secondary | ICD-10-CM | POA: Diagnosis not present

## 2012-12-20 DIAGNOSIS — I10 Essential (primary) hypertension: Secondary | ICD-10-CM | POA: Diagnosis not present

## 2012-12-20 DIAGNOSIS — Z4789 Encounter for other orthopedic aftercare: Secondary | ICD-10-CM | POA: Diagnosis not present

## 2012-12-21 DIAGNOSIS — M4120 Other idiopathic scoliosis, site unspecified: Secondary | ICD-10-CM | POA: Diagnosis not present

## 2012-12-21 DIAGNOSIS — I10 Essential (primary) hypertension: Secondary | ICD-10-CM | POA: Diagnosis not present

## 2012-12-21 DIAGNOSIS — Z4789 Encounter for other orthopedic aftercare: Secondary | ICD-10-CM | POA: Diagnosis not present

## 2012-12-21 DIAGNOSIS — M48061 Spinal stenosis, lumbar region without neurogenic claudication: Secondary | ICD-10-CM | POA: Diagnosis not present

## 2012-12-21 DIAGNOSIS — R269 Unspecified abnormalities of gait and mobility: Secondary | ICD-10-CM | POA: Diagnosis not present

## 2012-12-24 DIAGNOSIS — M48061 Spinal stenosis, lumbar region without neurogenic claudication: Secondary | ICD-10-CM | POA: Diagnosis not present

## 2012-12-24 DIAGNOSIS — I10 Essential (primary) hypertension: Secondary | ICD-10-CM | POA: Diagnosis not present

## 2012-12-24 DIAGNOSIS — R269 Unspecified abnormalities of gait and mobility: Secondary | ICD-10-CM | POA: Diagnosis not present

## 2012-12-24 DIAGNOSIS — M4120 Other idiopathic scoliosis, site unspecified: Secondary | ICD-10-CM | POA: Diagnosis not present

## 2012-12-24 DIAGNOSIS — Z4789 Encounter for other orthopedic aftercare: Secondary | ICD-10-CM | POA: Diagnosis not present

## 2012-12-25 DIAGNOSIS — M545 Low back pain, unspecified: Secondary | ICD-10-CM | POA: Diagnosis not present

## 2012-12-26 DIAGNOSIS — Z4789 Encounter for other orthopedic aftercare: Secondary | ICD-10-CM | POA: Diagnosis not present

## 2012-12-26 DIAGNOSIS — I10 Essential (primary) hypertension: Secondary | ICD-10-CM | POA: Diagnosis not present

## 2012-12-26 DIAGNOSIS — R269 Unspecified abnormalities of gait and mobility: Secondary | ICD-10-CM | POA: Diagnosis not present

## 2012-12-26 DIAGNOSIS — M4120 Other idiopathic scoliosis, site unspecified: Secondary | ICD-10-CM | POA: Diagnosis not present

## 2012-12-26 DIAGNOSIS — M48061 Spinal stenosis, lumbar region without neurogenic claudication: Secondary | ICD-10-CM | POA: Diagnosis not present

## 2012-12-27 DIAGNOSIS — Z4789 Encounter for other orthopedic aftercare: Secondary | ICD-10-CM | POA: Diagnosis not present

## 2012-12-27 DIAGNOSIS — M48061 Spinal stenosis, lumbar region without neurogenic claudication: Secondary | ICD-10-CM | POA: Diagnosis not present

## 2012-12-27 DIAGNOSIS — I10 Essential (primary) hypertension: Secondary | ICD-10-CM | POA: Diagnosis not present

## 2012-12-27 DIAGNOSIS — M4120 Other idiopathic scoliosis, site unspecified: Secondary | ICD-10-CM | POA: Diagnosis not present

## 2012-12-27 DIAGNOSIS — R269 Unspecified abnormalities of gait and mobility: Secondary | ICD-10-CM | POA: Diagnosis not present

## 2012-12-31 DIAGNOSIS — R269 Unspecified abnormalities of gait and mobility: Secondary | ICD-10-CM | POA: Diagnosis not present

## 2012-12-31 DIAGNOSIS — Z4789 Encounter for other orthopedic aftercare: Secondary | ICD-10-CM | POA: Diagnosis not present

## 2012-12-31 DIAGNOSIS — M4120 Other idiopathic scoliosis, site unspecified: Secondary | ICD-10-CM | POA: Diagnosis not present

## 2012-12-31 DIAGNOSIS — M48061 Spinal stenosis, lumbar region without neurogenic claudication: Secondary | ICD-10-CM | POA: Diagnosis not present

## 2012-12-31 DIAGNOSIS — I10 Essential (primary) hypertension: Secondary | ICD-10-CM | POA: Diagnosis not present

## 2013-01-02 DIAGNOSIS — Z4789 Encounter for other orthopedic aftercare: Secondary | ICD-10-CM | POA: Diagnosis not present

## 2013-01-02 DIAGNOSIS — R269 Unspecified abnormalities of gait and mobility: Secondary | ICD-10-CM | POA: Diagnosis not present

## 2013-01-02 DIAGNOSIS — M4120 Other idiopathic scoliosis, site unspecified: Secondary | ICD-10-CM | POA: Diagnosis not present

## 2013-01-02 DIAGNOSIS — M48061 Spinal stenosis, lumbar region without neurogenic claudication: Secondary | ICD-10-CM | POA: Diagnosis not present

## 2013-01-02 DIAGNOSIS — I10 Essential (primary) hypertension: Secondary | ICD-10-CM | POA: Diagnosis not present

## 2013-01-07 DIAGNOSIS — M48061 Spinal stenosis, lumbar region without neurogenic claudication: Secondary | ICD-10-CM | POA: Diagnosis not present

## 2013-01-07 DIAGNOSIS — I10 Essential (primary) hypertension: Secondary | ICD-10-CM | POA: Diagnosis not present

## 2013-01-07 DIAGNOSIS — R269 Unspecified abnormalities of gait and mobility: Secondary | ICD-10-CM | POA: Diagnosis not present

## 2013-01-07 DIAGNOSIS — M4120 Other idiopathic scoliosis, site unspecified: Secondary | ICD-10-CM | POA: Diagnosis not present

## 2013-01-07 DIAGNOSIS — Z4789 Encounter for other orthopedic aftercare: Secondary | ICD-10-CM | POA: Diagnosis not present

## 2013-01-09 DIAGNOSIS — N039 Chronic nephritic syndrome with unspecified morphologic changes: Secondary | ICD-10-CM | POA: Diagnosis not present

## 2013-01-09 DIAGNOSIS — M1990 Unspecified osteoarthritis, unspecified site: Secondary | ICD-10-CM | POA: Diagnosis not present

## 2013-01-09 DIAGNOSIS — Z6829 Body mass index (BMI) 29.0-29.9, adult: Secondary | ICD-10-CM | POA: Diagnosis not present

## 2013-01-09 DIAGNOSIS — I10 Essential (primary) hypertension: Secondary | ICD-10-CM | POA: Diagnosis not present

## 2013-01-09 DIAGNOSIS — E785 Hyperlipidemia, unspecified: Secondary | ICD-10-CM | POA: Diagnosis not present

## 2013-01-09 DIAGNOSIS — N183 Chronic kidney disease, stage 3 unspecified: Secondary | ICD-10-CM | POA: Diagnosis not present

## 2013-01-09 DIAGNOSIS — D631 Anemia in chronic kidney disease: Secondary | ICD-10-CM | POA: Diagnosis not present

## 2013-01-09 DIAGNOSIS — Z1331 Encounter for screening for depression: Secondary | ICD-10-CM | POA: Diagnosis not present

## 2013-01-10 DIAGNOSIS — I10 Essential (primary) hypertension: Secondary | ICD-10-CM | POA: Diagnosis not present

## 2013-01-10 DIAGNOSIS — Z4789 Encounter for other orthopedic aftercare: Secondary | ICD-10-CM | POA: Diagnosis not present

## 2013-01-10 DIAGNOSIS — M48061 Spinal stenosis, lumbar region without neurogenic claudication: Secondary | ICD-10-CM | POA: Diagnosis not present

## 2013-01-10 DIAGNOSIS — R269 Unspecified abnormalities of gait and mobility: Secondary | ICD-10-CM | POA: Diagnosis not present

## 2013-01-10 DIAGNOSIS — M4120 Other idiopathic scoliosis, site unspecified: Secondary | ICD-10-CM | POA: Diagnosis not present

## 2013-02-12 DIAGNOSIS — N138 Other obstructive and reflux uropathy: Secondary | ICD-10-CM | POA: Diagnosis not present

## 2013-02-12 DIAGNOSIS — N401 Enlarged prostate with lower urinary tract symptoms: Secondary | ICD-10-CM | POA: Diagnosis not present

## 2013-04-09 DIAGNOSIS — I129 Hypertensive chronic kidney disease with stage 1 through stage 4 chronic kidney disease, or unspecified chronic kidney disease: Secondary | ICD-10-CM | POA: Diagnosis not present

## 2013-04-09 DIAGNOSIS — N184 Chronic kidney disease, stage 4 (severe): Secondary | ICD-10-CM | POA: Diagnosis not present

## 2013-04-09 DIAGNOSIS — E213 Hyperparathyroidism, unspecified: Secondary | ICD-10-CM | POA: Diagnosis not present

## 2013-04-09 DIAGNOSIS — N390 Urinary tract infection, site not specified: Secondary | ICD-10-CM | POA: Diagnosis not present

## 2013-04-09 DIAGNOSIS — D649 Anemia, unspecified: Secondary | ICD-10-CM | POA: Diagnosis not present

## 2013-04-09 DIAGNOSIS — N2581 Secondary hyperparathyroidism of renal origin: Secondary | ICD-10-CM | POA: Diagnosis not present

## 2013-04-26 DIAGNOSIS — M48062 Spinal stenosis, lumbar region with neurogenic claudication: Secondary | ICD-10-CM | POA: Diagnosis not present

## 2013-04-26 DIAGNOSIS — M4120 Other idiopathic scoliosis, site unspecified: Secondary | ICD-10-CM | POA: Diagnosis not present

## 2013-04-26 DIAGNOSIS — M47817 Spondylosis without myelopathy or radiculopathy, lumbosacral region: Secondary | ICD-10-CM | POA: Diagnosis not present

## 2013-04-26 DIAGNOSIS — M4310 Spondylolisthesis, site unspecified: Secondary | ICD-10-CM | POA: Diagnosis not present

## 2013-05-07 DIAGNOSIS — N138 Other obstructive and reflux uropathy: Secondary | ICD-10-CM | POA: Diagnosis not present

## 2013-05-07 DIAGNOSIS — N401 Enlarged prostate with lower urinary tract symptoms: Secondary | ICD-10-CM | POA: Diagnosis not present

## 2013-06-04 DIAGNOSIS — Z23 Encounter for immunization: Secondary | ICD-10-CM | POA: Diagnosis not present

## 2013-07-05 DIAGNOSIS — I10 Essential (primary) hypertension: Secondary | ICD-10-CM | POA: Diagnosis not present

## 2013-07-05 DIAGNOSIS — E785 Hyperlipidemia, unspecified: Secondary | ICD-10-CM | POA: Diagnosis not present

## 2013-07-05 DIAGNOSIS — R82998 Other abnormal findings in urine: Secondary | ICD-10-CM | POA: Diagnosis not present

## 2013-07-05 DIAGNOSIS — Z125 Encounter for screening for malignant neoplasm of prostate: Secondary | ICD-10-CM | POA: Diagnosis not present

## 2013-07-12 DIAGNOSIS — I10 Essential (primary) hypertension: Secondary | ICD-10-CM | POA: Diagnosis not present

## 2013-07-12 DIAGNOSIS — D631 Anemia in chronic kidney disease: Secondary | ICD-10-CM | POA: Diagnosis not present

## 2013-07-12 DIAGNOSIS — M1990 Unspecified osteoarthritis, unspecified site: Secondary | ICD-10-CM | POA: Diagnosis not present

## 2013-07-12 DIAGNOSIS — Z0000 Encounter for general adult medical examination without abnormal findings: Secondary | ICD-10-CM | POA: Diagnosis not present

## 2013-07-12 DIAGNOSIS — E785 Hyperlipidemia, unspecified: Secondary | ICD-10-CM | POA: Diagnosis not present

## 2013-07-12 DIAGNOSIS — Z23 Encounter for immunization: Secondary | ICD-10-CM | POA: Diagnosis not present

## 2013-07-12 DIAGNOSIS — N183 Chronic kidney disease, stage 3 unspecified: Secondary | ICD-10-CM | POA: Diagnosis not present

## 2013-07-12 DIAGNOSIS — Z6831 Body mass index (BMI) 31.0-31.9, adult: Secondary | ICD-10-CM | POA: Diagnosis not present

## 2013-07-15 DIAGNOSIS — Z1212 Encounter for screening for malignant neoplasm of rectum: Secondary | ICD-10-CM | POA: Diagnosis not present

## 2013-09-03 DIAGNOSIS — I10 Essential (primary) hypertension: Secondary | ICD-10-CM | POA: Diagnosis not present

## 2013-09-03 DIAGNOSIS — E0590 Thyrotoxicosis, unspecified without thyrotoxic crisis or storm: Secondary | ICD-10-CM | POA: Diagnosis not present

## 2013-09-03 DIAGNOSIS — N184 Chronic kidney disease, stage 4 (severe): Secondary | ICD-10-CM | POA: Diagnosis not present

## 2013-09-03 DIAGNOSIS — M1990 Unspecified osteoarthritis, unspecified site: Secondary | ICD-10-CM | POA: Diagnosis not present

## 2013-09-09 DIAGNOSIS — N390 Urinary tract infection, site not specified: Secondary | ICD-10-CM | POA: Diagnosis not present

## 2013-11-05 DIAGNOSIS — N138 Other obstructive and reflux uropathy: Secondary | ICD-10-CM | POA: Diagnosis not present

## 2013-11-05 DIAGNOSIS — N200 Calculus of kidney: Secondary | ICD-10-CM | POA: Diagnosis not present

## 2013-11-05 DIAGNOSIS — N3941 Urge incontinence: Secondary | ICD-10-CM | POA: Diagnosis not present

## 2013-11-05 DIAGNOSIS — N401 Enlarged prostate with lower urinary tract symptoms: Secondary | ICD-10-CM | POA: Diagnosis not present

## 2013-11-05 DIAGNOSIS — N390 Urinary tract infection, site not specified: Secondary | ICD-10-CM | POA: Diagnosis not present

## 2013-11-14 DIAGNOSIS — N390 Urinary tract infection, site not specified: Secondary | ICD-10-CM | POA: Diagnosis not present

## 2013-11-26 DIAGNOSIS — N139 Obstructive and reflux uropathy, unspecified: Secondary | ICD-10-CM | POA: Diagnosis not present

## 2013-11-26 DIAGNOSIS — N3941 Urge incontinence: Secondary | ICD-10-CM | POA: Diagnosis not present

## 2013-11-26 DIAGNOSIS — N401 Enlarged prostate with lower urinary tract symptoms: Secondary | ICD-10-CM | POA: Diagnosis not present

## 2013-11-26 DIAGNOSIS — N138 Other obstructive and reflux uropathy: Secondary | ICD-10-CM | POA: Diagnosis not present

## 2014-01-06 DIAGNOSIS — D631 Anemia in chronic kidney disease: Secondary | ICD-10-CM | POA: Diagnosis not present

## 2014-01-06 DIAGNOSIS — N184 Chronic kidney disease, stage 4 (severe): Secondary | ICD-10-CM | POA: Diagnosis not present

## 2014-01-06 DIAGNOSIS — N2581 Secondary hyperparathyroidism of renal origin: Secondary | ICD-10-CM | POA: Diagnosis not present

## 2014-01-06 DIAGNOSIS — I129 Hypertensive chronic kidney disease with stage 1 through stage 4 chronic kidney disease, or unspecified chronic kidney disease: Secondary | ICD-10-CM | POA: Diagnosis not present

## 2014-01-15 DIAGNOSIS — K219 Gastro-esophageal reflux disease without esophagitis: Secondary | ICD-10-CM | POA: Diagnosis not present

## 2014-01-15 DIAGNOSIS — I10 Essential (primary) hypertension: Secondary | ICD-10-CM | POA: Diagnosis not present

## 2014-01-15 DIAGNOSIS — M1990 Unspecified osteoarthritis, unspecified site: Secondary | ICD-10-CM | POA: Diagnosis not present

## 2014-01-15 DIAGNOSIS — N183 Chronic kidney disease, stage 3 unspecified: Secondary | ICD-10-CM | POA: Diagnosis not present

## 2014-01-15 DIAGNOSIS — Z6830 Body mass index (BMI) 30.0-30.9, adult: Secondary | ICD-10-CM | POA: Diagnosis not present

## 2014-01-15 DIAGNOSIS — D631 Anemia in chronic kidney disease: Secondary | ICD-10-CM | POA: Diagnosis not present

## 2014-01-15 DIAGNOSIS — Z1331 Encounter for screening for depression: Secondary | ICD-10-CM | POA: Diagnosis not present

## 2014-01-15 DIAGNOSIS — E785 Hyperlipidemia, unspecified: Secondary | ICD-10-CM | POA: Diagnosis not present

## 2014-01-16 DIAGNOSIS — N184 Chronic kidney disease, stage 4 (severe): Secondary | ICD-10-CM | POA: Diagnosis not present

## 2014-05-20 DIAGNOSIS — N184 Chronic kidney disease, stage 4 (severe): Secondary | ICD-10-CM | POA: Diagnosis not present

## 2014-05-20 DIAGNOSIS — N189 Chronic kidney disease, unspecified: Secondary | ICD-10-CM | POA: Diagnosis not present

## 2014-05-20 DIAGNOSIS — D631 Anemia in chronic kidney disease: Secondary | ICD-10-CM | POA: Diagnosis not present

## 2014-05-20 DIAGNOSIS — I129 Hypertensive chronic kidney disease with stage 1 through stage 4 chronic kidney disease, or unspecified chronic kidney disease: Secondary | ICD-10-CM | POA: Diagnosis not present

## 2014-05-20 DIAGNOSIS — N398 Other specified disorders of urinary system: Secondary | ICD-10-CM | POA: Diagnosis not present

## 2014-05-20 DIAGNOSIS — N2581 Secondary hyperparathyroidism of renal origin: Secondary | ICD-10-CM | POA: Diagnosis not present

## 2014-06-04 DIAGNOSIS — N3941 Urge incontinence: Secondary | ICD-10-CM | POA: Diagnosis not present

## 2014-06-04 DIAGNOSIS — R351 Nocturia: Secondary | ICD-10-CM | POA: Diagnosis not present

## 2014-07-11 DIAGNOSIS — Z0000 Encounter for general adult medical examination without abnormal findings: Secondary | ICD-10-CM | POA: Diagnosis not present

## 2014-07-11 DIAGNOSIS — I10 Essential (primary) hypertension: Secondary | ICD-10-CM | POA: Diagnosis not present

## 2014-07-11 DIAGNOSIS — Z125 Encounter for screening for malignant neoplasm of prostate: Secondary | ICD-10-CM | POA: Diagnosis not present

## 2014-07-11 DIAGNOSIS — E785 Hyperlipidemia, unspecified: Secondary | ICD-10-CM | POA: Diagnosis not present

## 2014-07-16 DIAGNOSIS — Z1212 Encounter for screening for malignant neoplasm of rectum: Secondary | ICD-10-CM | POA: Diagnosis not present

## 2014-07-16 DIAGNOSIS — D631 Anemia in chronic kidney disease: Secondary | ICD-10-CM | POA: Diagnosis not present

## 2014-07-16 DIAGNOSIS — Z6832 Body mass index (BMI) 32.0-32.9, adult: Secondary | ICD-10-CM | POA: Diagnosis not present

## 2014-07-16 DIAGNOSIS — I10 Essential (primary) hypertension: Secondary | ICD-10-CM | POA: Diagnosis not present

## 2014-07-16 DIAGNOSIS — M1990 Unspecified osteoarthritis, unspecified site: Secondary | ICD-10-CM | POA: Diagnosis not present

## 2014-07-16 DIAGNOSIS — Z1389 Encounter for screening for other disorder: Secondary | ICD-10-CM | POA: Diagnosis not present

## 2014-07-16 DIAGNOSIS — N183 Chronic kidney disease, stage 3 (moderate): Secondary | ICD-10-CM | POA: Diagnosis not present

## 2014-07-16 DIAGNOSIS — E785 Hyperlipidemia, unspecified: Secondary | ICD-10-CM | POA: Diagnosis not present

## 2014-07-16 DIAGNOSIS — Z0000 Encounter for general adult medical examination without abnormal findings: Secondary | ICD-10-CM | POA: Diagnosis not present

## 2014-08-11 DIAGNOSIS — Z96651 Presence of right artificial knee joint: Secondary | ICD-10-CM | POA: Diagnosis not present

## 2014-08-11 DIAGNOSIS — M19011 Primary osteoarthritis, right shoulder: Secondary | ICD-10-CM | POA: Diagnosis not present

## 2014-08-13 DIAGNOSIS — Z96651 Presence of right artificial knee joint: Secondary | ICD-10-CM | POA: Diagnosis not present

## 2014-08-13 DIAGNOSIS — M6281 Muscle weakness (generalized): Secondary | ICD-10-CM | POA: Diagnosis not present

## 2014-09-22 DIAGNOSIS — M19011 Primary osteoarthritis, right shoulder: Secondary | ICD-10-CM | POA: Diagnosis not present

## 2014-09-22 DIAGNOSIS — M4716 Other spondylosis with myelopathy, lumbar region: Secondary | ICD-10-CM | POA: Diagnosis not present

## 2014-09-25 DIAGNOSIS — N184 Chronic kidney disease, stage 4 (severe): Secondary | ICD-10-CM | POA: Diagnosis not present

## 2014-09-25 DIAGNOSIS — N398 Other specified disorders of urinary system: Secondary | ICD-10-CM | POA: Diagnosis not present

## 2014-09-25 DIAGNOSIS — D631 Anemia in chronic kidney disease: Secondary | ICD-10-CM | POA: Diagnosis not present

## 2014-09-25 DIAGNOSIS — N2581 Secondary hyperparathyroidism of renal origin: Secondary | ICD-10-CM | POA: Diagnosis not present

## 2014-09-29 DIAGNOSIS — R262 Difficulty in walking, not elsewhere classified: Secondary | ICD-10-CM | POA: Diagnosis not present

## 2014-09-29 DIAGNOSIS — M6281 Muscle weakness (generalized): Secondary | ICD-10-CM | POA: Diagnosis not present

## 2014-10-03 DIAGNOSIS — M6281 Muscle weakness (generalized): Secondary | ICD-10-CM | POA: Diagnosis not present

## 2014-10-03 DIAGNOSIS — R262 Difficulty in walking, not elsewhere classified: Secondary | ICD-10-CM | POA: Diagnosis not present

## 2014-10-06 DIAGNOSIS — R262 Difficulty in walking, not elsewhere classified: Secondary | ICD-10-CM | POA: Diagnosis not present

## 2014-10-06 DIAGNOSIS — M6281 Muscle weakness (generalized): Secondary | ICD-10-CM | POA: Diagnosis not present

## 2014-10-09 DIAGNOSIS — M6281 Muscle weakness (generalized): Secondary | ICD-10-CM | POA: Diagnosis not present

## 2014-10-09 DIAGNOSIS — R262 Difficulty in walking, not elsewhere classified: Secondary | ICD-10-CM | POA: Diagnosis not present

## 2014-10-12 IMAGING — RF DG LUMBAR SPINE 2-3V
1 series · 2 of 2 positions shown · non-contrast
Comparison: Plain films lumbar spine 08/28/2012.

CLINICAL DATA: L1-4 fusion.

DG C-ARM GT 120 MIN, LUMBAR SPINE - 2-3 VIEW

[Series 1: run · 2 of 2 slices shown]
[im 1/2]
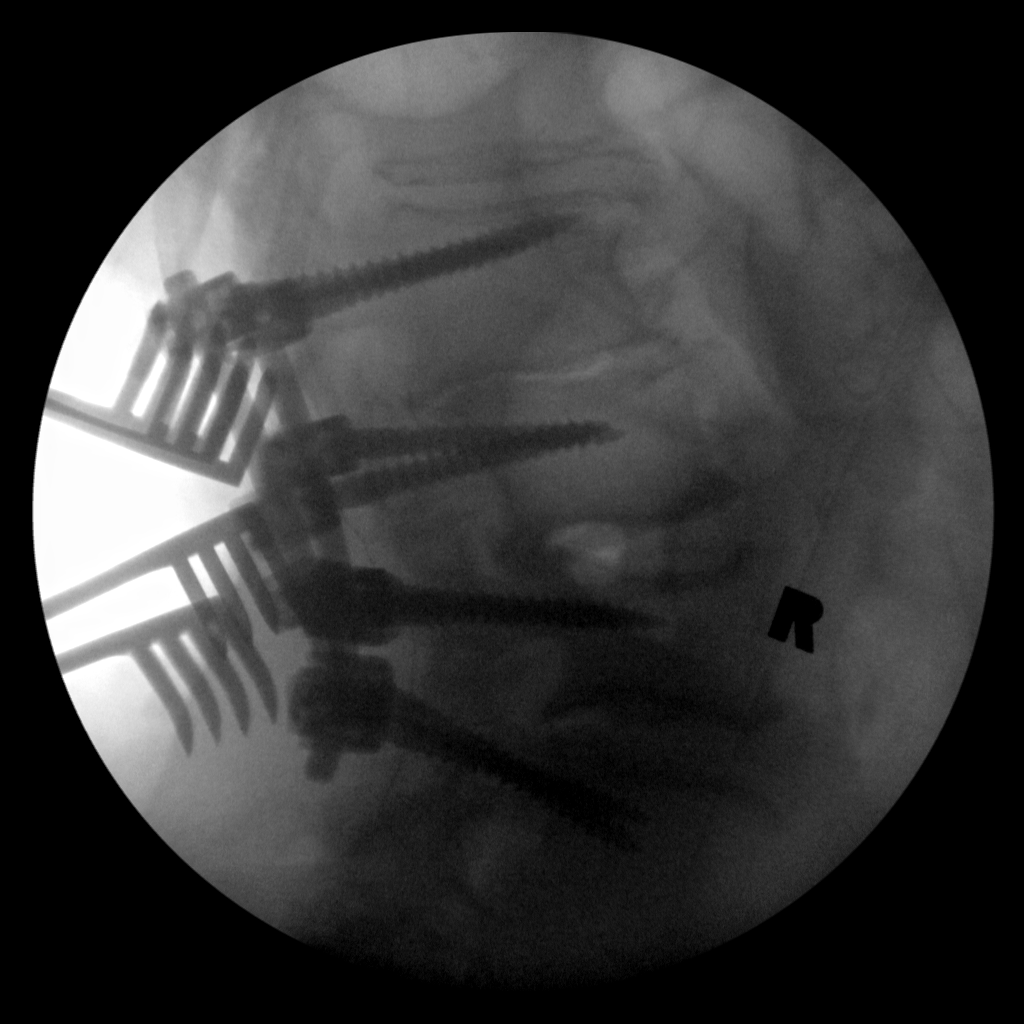
[im 2/2]
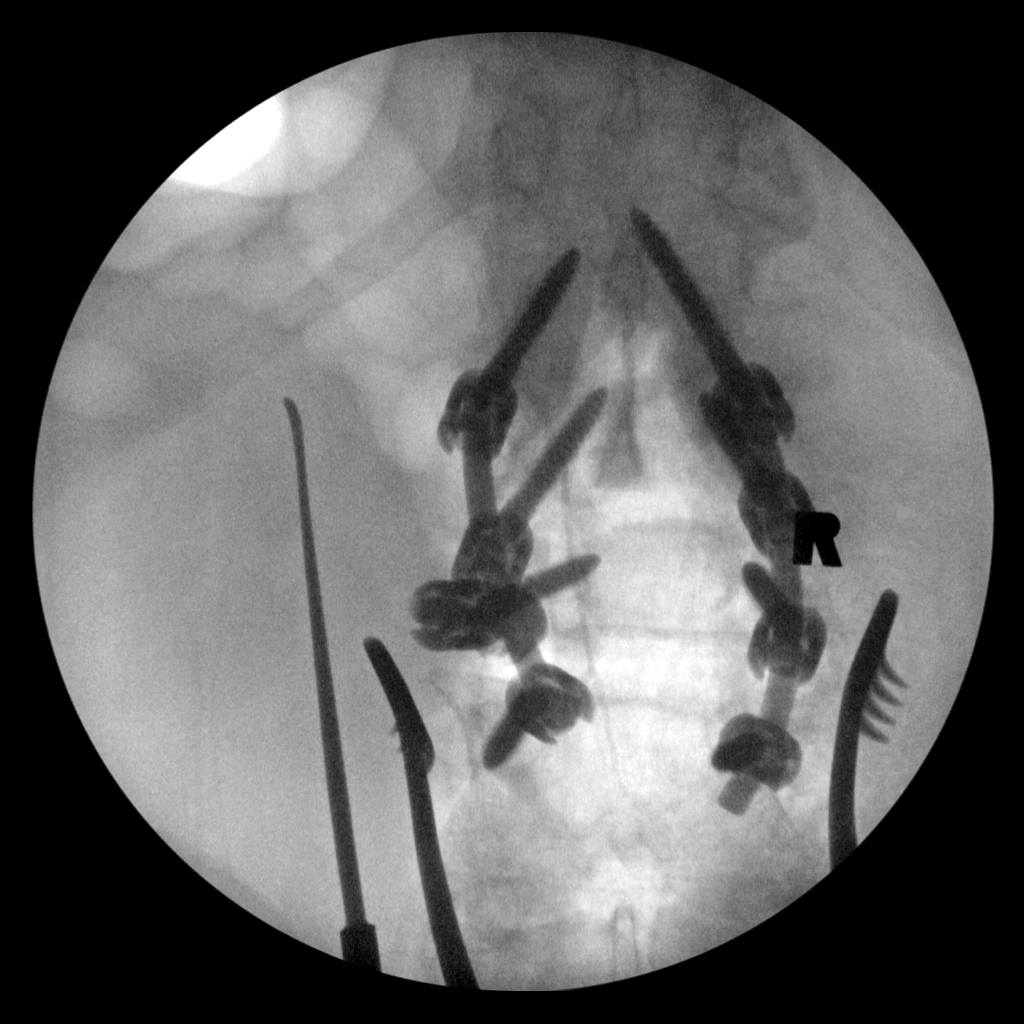

[2 of 2 positions shown; findings below may reference images not displayed]

FINDINGS: We are provided to the fluoroscopic intraoperative spot
views of the lumbar spine.  Images demonstrate pedicle screws and
stabilization bars in place from L1-L4.
IMPRESSION: L1-4 fusion.

## 2014-10-12 IMAGING — CR DG LUMBAR SPINE 2-3V
3 series · 3 of 3 positions shown · non-contrast
Comparison: Plain films lumbar spine 08/28/2012.

CLINICAL DATA: L1-4 fusion.

LUMBAR SPINE - 2-3 VIEW

[view not recorded (1 of 3)]
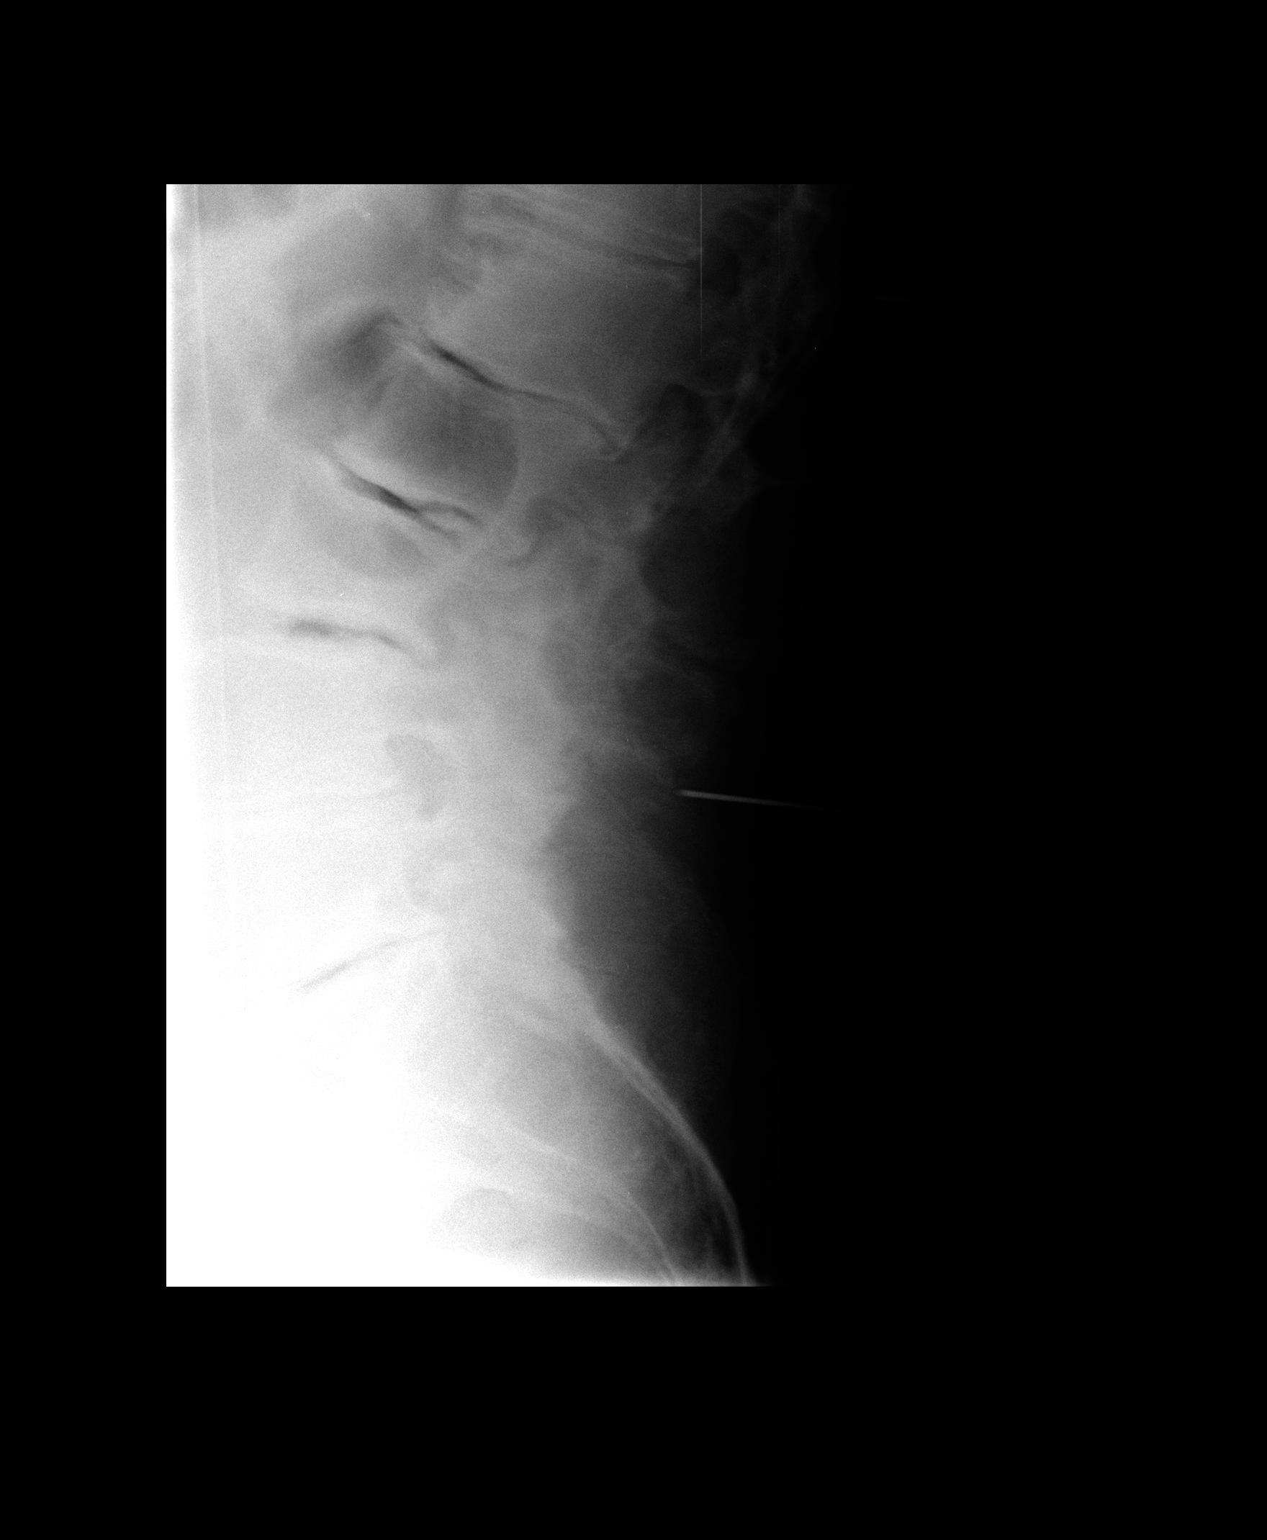

[view not recorded (2 of 3)]
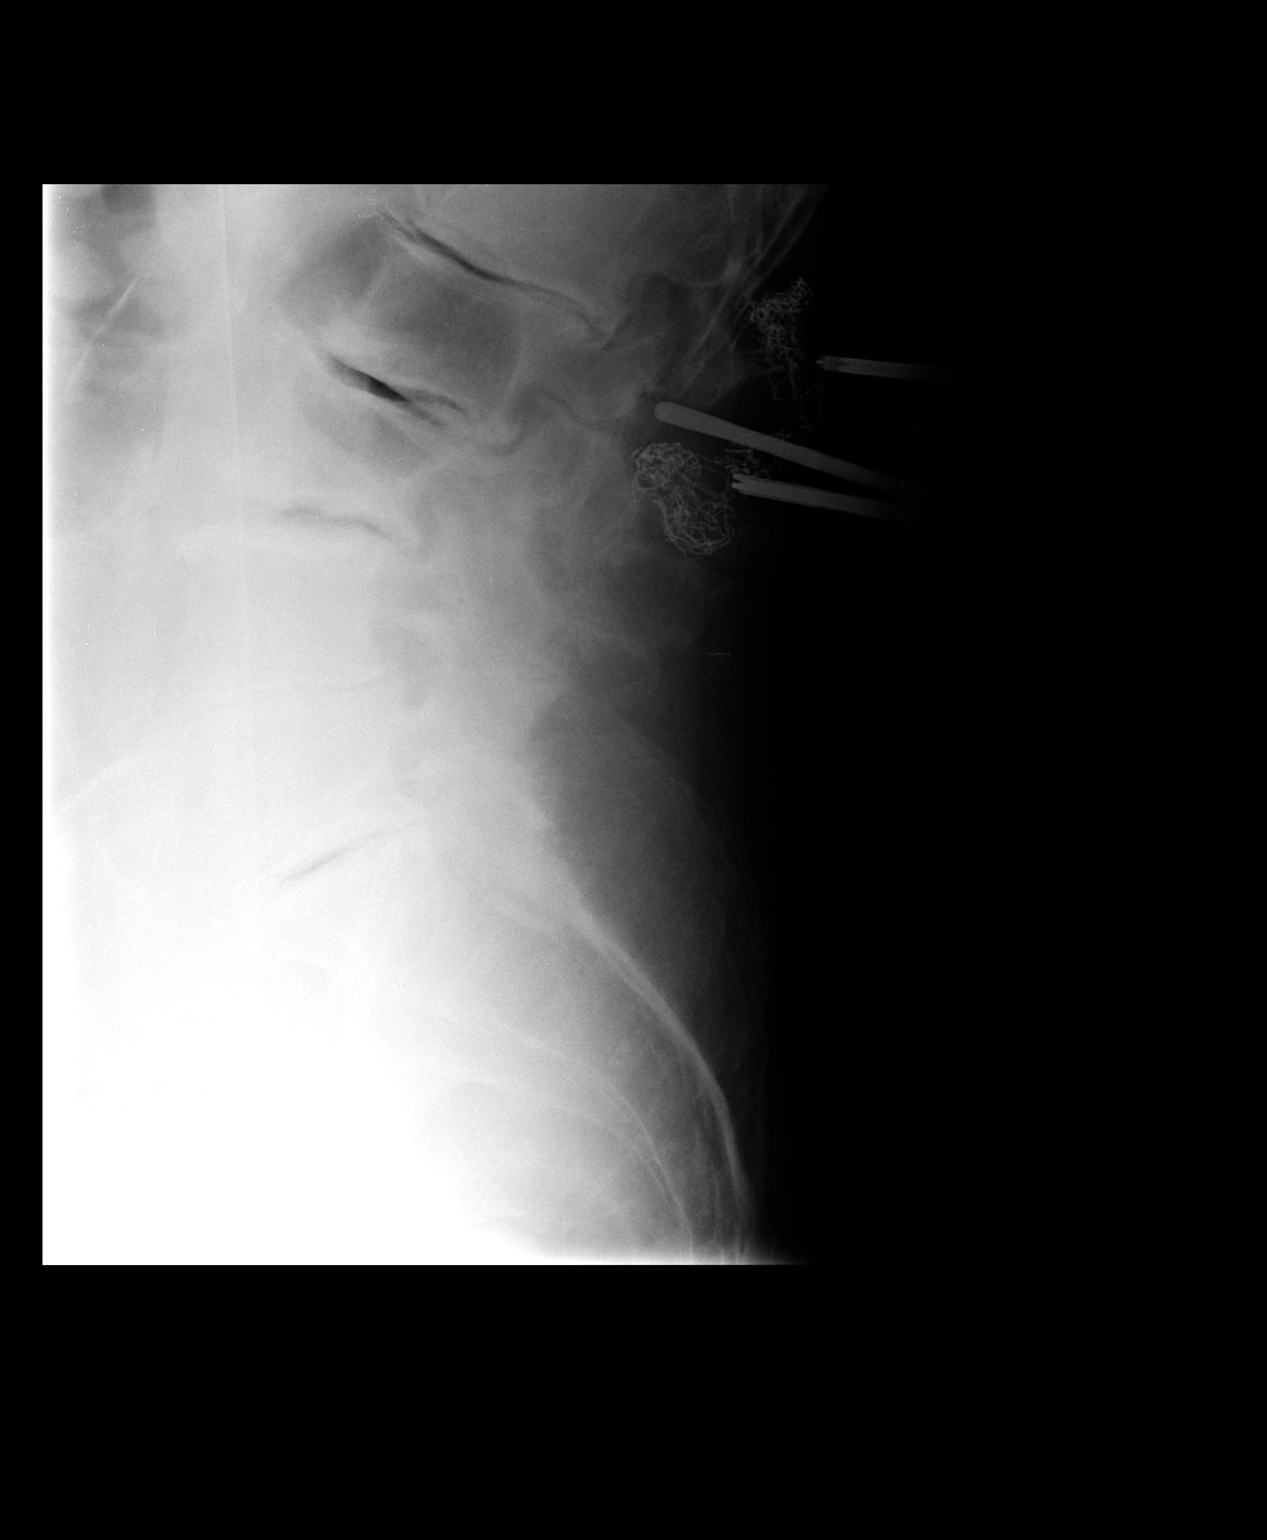

[view not recorded (3 of 3)]
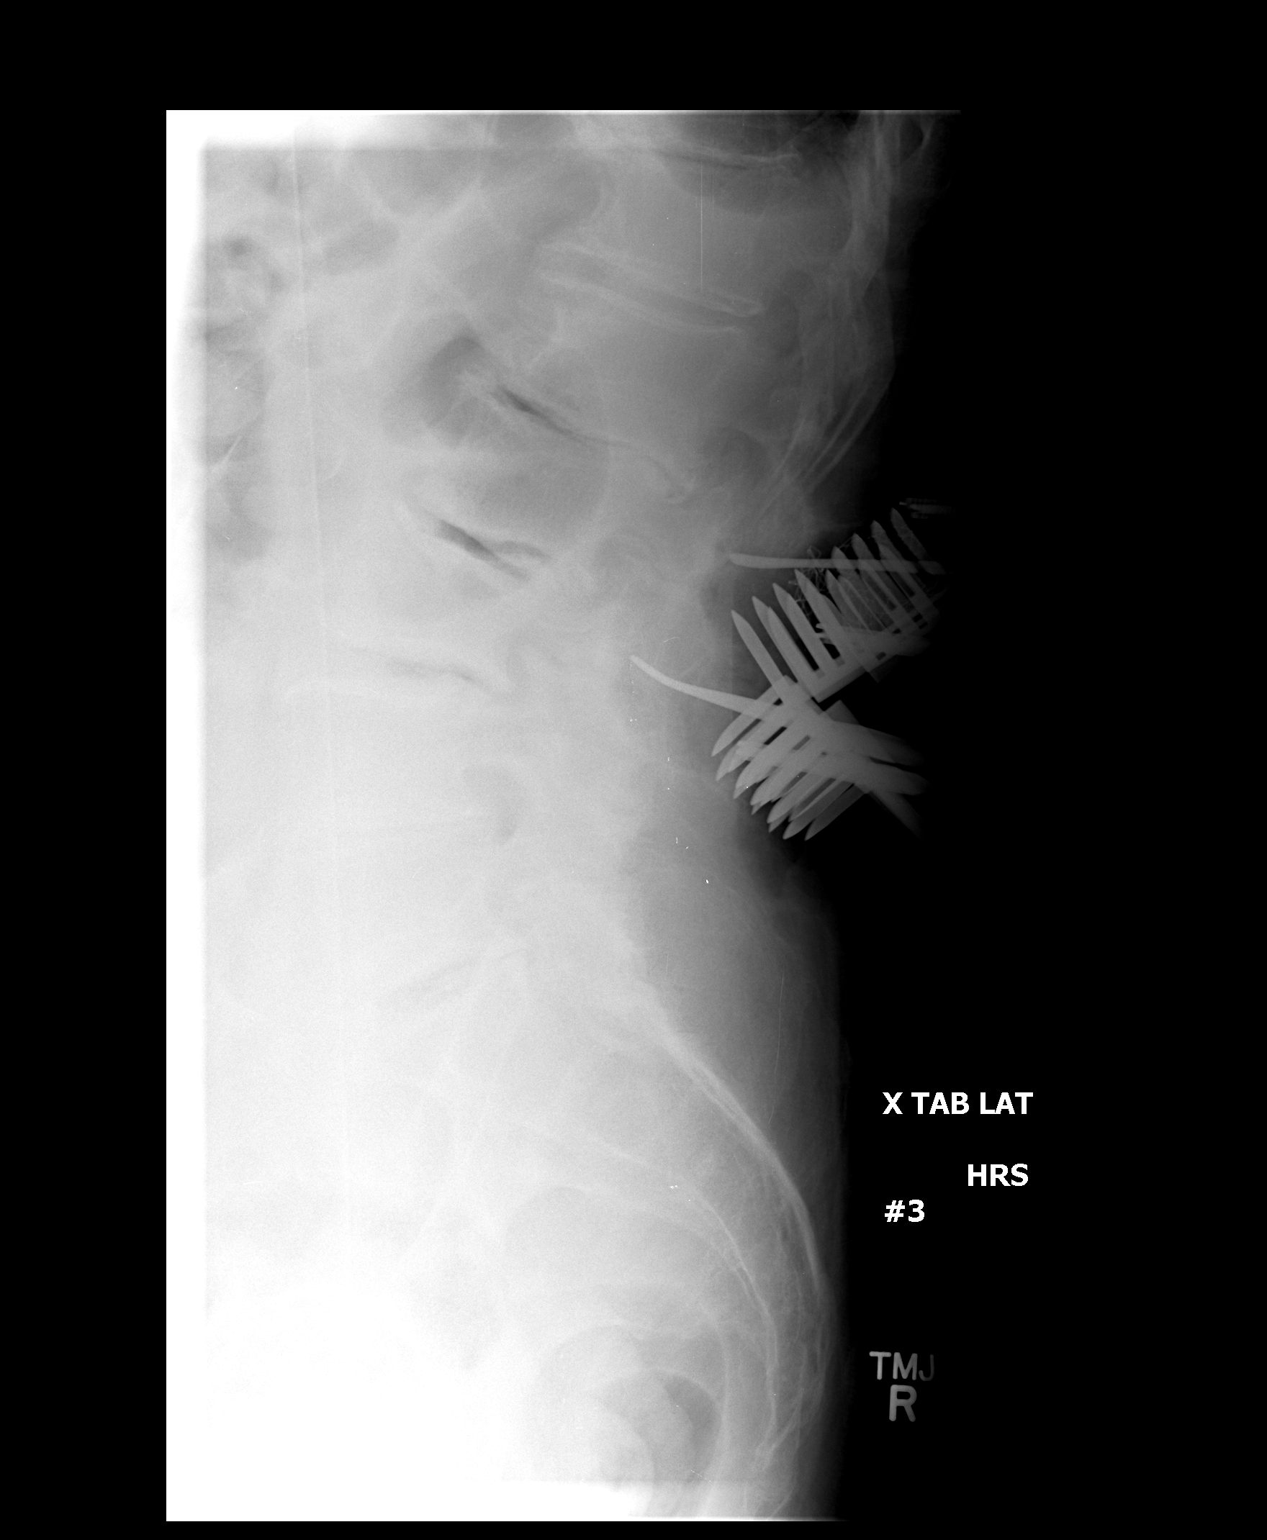

[3 of 3 positions shown; findings below may reference images not displayed]

FINDINGS: We are provided with three intraoperative views of the
lumbar spine in the lateral projection.  On image #1, a probe is
directed toward the L4-5 interspace.  On film labeled #2, probes
are directed toward the L1-2 interspace and the L2 pedicles.  On
film labeled #3, there is localization of the L2 and L3 pedicles.
IMPRESSION: Localization as above.

## 2014-10-13 DIAGNOSIS — R262 Difficulty in walking, not elsewhere classified: Secondary | ICD-10-CM | POA: Diagnosis not present

## 2014-10-13 DIAGNOSIS — M6281 Muscle weakness (generalized): Secondary | ICD-10-CM | POA: Diagnosis not present

## 2014-10-15 DIAGNOSIS — M6281 Muscle weakness (generalized): Secondary | ICD-10-CM | POA: Diagnosis not present

## 2014-10-15 DIAGNOSIS — R262 Difficulty in walking, not elsewhere classified: Secondary | ICD-10-CM | POA: Diagnosis not present

## 2014-10-20 DIAGNOSIS — M6281 Muscle weakness (generalized): Secondary | ICD-10-CM | POA: Diagnosis not present

## 2014-10-20 DIAGNOSIS — R262 Difficulty in walking, not elsewhere classified: Secondary | ICD-10-CM | POA: Diagnosis not present

## 2014-10-22 DIAGNOSIS — M6281 Muscle weakness (generalized): Secondary | ICD-10-CM | POA: Diagnosis not present

## 2014-10-22 DIAGNOSIS — R262 Difficulty in walking, not elsewhere classified: Secondary | ICD-10-CM | POA: Diagnosis not present

## 2014-12-16 DIAGNOSIS — N138 Other obstructive and reflux uropathy: Secondary | ICD-10-CM | POA: Diagnosis not present

## 2014-12-16 DIAGNOSIS — N401 Enlarged prostate with lower urinary tract symptoms: Secondary | ICD-10-CM | POA: Diagnosis not present

## 2014-12-16 DIAGNOSIS — N3941 Urge incontinence: Secondary | ICD-10-CM | POA: Diagnosis not present

## 2014-12-16 DIAGNOSIS — R339 Retention of urine, unspecified: Secondary | ICD-10-CM | POA: Diagnosis not present

## 2015-01-16 DIAGNOSIS — K219 Gastro-esophageal reflux disease without esophagitis: Secondary | ICD-10-CM | POA: Diagnosis not present

## 2015-01-16 DIAGNOSIS — E785 Hyperlipidemia, unspecified: Secondary | ICD-10-CM | POA: Diagnosis not present

## 2015-01-16 DIAGNOSIS — D631 Anemia in chronic kidney disease: Secondary | ICD-10-CM | POA: Diagnosis not present

## 2015-01-16 DIAGNOSIS — Z1389 Encounter for screening for other disorder: Secondary | ICD-10-CM | POA: Diagnosis not present

## 2015-01-16 DIAGNOSIS — Z6832 Body mass index (BMI) 32.0-32.9, adult: Secondary | ICD-10-CM | POA: Diagnosis not present

## 2015-01-16 DIAGNOSIS — I10 Essential (primary) hypertension: Secondary | ICD-10-CM | POA: Diagnosis not present

## 2015-01-16 DIAGNOSIS — M1990 Unspecified osteoarthritis, unspecified site: Secondary | ICD-10-CM | POA: Diagnosis not present

## 2015-01-16 DIAGNOSIS — N183 Chronic kidney disease, stage 3 (moderate): Secondary | ICD-10-CM | POA: Diagnosis not present

## 2015-07-31 DIAGNOSIS — Z125 Encounter for screening for malignant neoplasm of prostate: Secondary | ICD-10-CM | POA: Diagnosis not present

## 2015-07-31 DIAGNOSIS — I10 Essential (primary) hypertension: Secondary | ICD-10-CM | POA: Diagnosis not present

## 2015-08-14 DIAGNOSIS — Z0000 Encounter for general adult medical examination without abnormal findings: Secondary | ICD-10-CM | POA: Diagnosis not present

## 2015-08-14 DIAGNOSIS — M4806 Spinal stenosis, lumbar region: Secondary | ICD-10-CM | POA: Diagnosis not present

## 2015-08-14 DIAGNOSIS — Z6832 Body mass index (BMI) 32.0-32.9, adult: Secondary | ICD-10-CM | POA: Diagnosis not present

## 2015-08-14 DIAGNOSIS — N189 Chronic kidney disease, unspecified: Secondary | ICD-10-CM | POA: Diagnosis not present

## 2015-08-14 DIAGNOSIS — N183 Chronic kidney disease, stage 3 (moderate): Secondary | ICD-10-CM | POA: Diagnosis not present

## 2015-08-14 DIAGNOSIS — I129 Hypertensive chronic kidney disease with stage 1 through stage 4 chronic kidney disease, or unspecified chronic kidney disease: Secondary | ICD-10-CM | POA: Diagnosis not present

## 2015-08-14 DIAGNOSIS — M1990 Unspecified osteoarthritis, unspecified site: Secondary | ICD-10-CM | POA: Diagnosis not present

## 2015-08-14 DIAGNOSIS — K219 Gastro-esophageal reflux disease without esophagitis: Secondary | ICD-10-CM | POA: Diagnosis not present

## 2015-08-14 DIAGNOSIS — N184 Chronic kidney disease, stage 4 (severe): Secondary | ICD-10-CM | POA: Diagnosis not present

## 2015-08-14 DIAGNOSIS — I10 Essential (primary) hypertension: Secondary | ICD-10-CM | POA: Diagnosis not present

## 2015-08-14 DIAGNOSIS — N398 Other specified disorders of urinary system: Secondary | ICD-10-CM | POA: Diagnosis not present

## 2015-08-14 DIAGNOSIS — E784 Other hyperlipidemia: Secondary | ICD-10-CM | POA: Diagnosis not present

## 2015-08-14 DIAGNOSIS — D631 Anemia in chronic kidney disease: Secondary | ICD-10-CM | POA: Diagnosis not present

## 2015-08-14 DIAGNOSIS — N2581 Secondary hyperparathyroidism of renal origin: Secondary | ICD-10-CM | POA: Diagnosis not present

## 2015-08-14 DIAGNOSIS — Z1389 Encounter for screening for other disorder: Secondary | ICD-10-CM | POA: Diagnosis not present

## 2016-02-15 DIAGNOSIS — N183 Chronic kidney disease, stage 3 (moderate): Secondary | ICD-10-CM | POA: Diagnosis not present

## 2016-02-15 DIAGNOSIS — I10 Essential (primary) hypertension: Secondary | ICD-10-CM | POA: Diagnosis not present

## 2016-02-15 DIAGNOSIS — D631 Anemia in chronic kidney disease: Secondary | ICD-10-CM | POA: Diagnosis not present

## 2016-02-15 DIAGNOSIS — K219 Gastro-esophageal reflux disease without esophagitis: Secondary | ICD-10-CM | POA: Diagnosis not present

## 2016-02-15 DIAGNOSIS — M1990 Unspecified osteoarthritis, unspecified site: Secondary | ICD-10-CM | POA: Diagnosis not present

## 2016-02-15 DIAGNOSIS — E784 Other hyperlipidemia: Secondary | ICD-10-CM | POA: Diagnosis not present

## 2016-02-15 DIAGNOSIS — I129 Hypertensive chronic kidney disease with stage 1 through stage 4 chronic kidney disease, or unspecified chronic kidney disease: Secondary | ICD-10-CM | POA: Diagnosis not present

## 2016-02-15 DIAGNOSIS — Z6832 Body mass index (BMI) 32.0-32.9, adult: Secondary | ICD-10-CM | POA: Diagnosis not present

## 2016-05-03 DIAGNOSIS — I129 Hypertensive chronic kidney disease with stage 1 through stage 4 chronic kidney disease, or unspecified chronic kidney disease: Secondary | ICD-10-CM | POA: Diagnosis not present

## 2016-05-03 DIAGNOSIS — E669 Obesity, unspecified: Secondary | ICD-10-CM | POA: Diagnosis not present

## 2016-05-03 DIAGNOSIS — D631 Anemia in chronic kidney disease: Secondary | ICD-10-CM | POA: Diagnosis not present

## 2016-05-03 DIAGNOSIS — M1990 Unspecified osteoarthritis, unspecified site: Secondary | ICD-10-CM | POA: Diagnosis not present

## 2016-05-03 DIAGNOSIS — N398 Other specified disorders of urinary system: Secondary | ICD-10-CM | POA: Diagnosis not present

## 2016-05-03 DIAGNOSIS — N189 Chronic kidney disease, unspecified: Secondary | ICD-10-CM | POA: Diagnosis not present

## 2016-05-03 DIAGNOSIS — N2581 Secondary hyperparathyroidism of renal origin: Secondary | ICD-10-CM | POA: Diagnosis not present

## 2016-05-03 DIAGNOSIS — N184 Chronic kidney disease, stage 4 (severe): Secondary | ICD-10-CM | POA: Diagnosis not present

## 2016-06-13 DIAGNOSIS — Z23 Encounter for immunization: Secondary | ICD-10-CM | POA: Diagnosis not present

## 2016-08-09 DIAGNOSIS — E784 Other hyperlipidemia: Secondary | ICD-10-CM | POA: Diagnosis not present

## 2016-08-09 DIAGNOSIS — I10 Essential (primary) hypertension: Secondary | ICD-10-CM | POA: Diagnosis not present

## 2016-08-09 DIAGNOSIS — I129 Hypertensive chronic kidney disease with stage 1 through stage 4 chronic kidney disease, or unspecified chronic kidney disease: Secondary | ICD-10-CM | POA: Diagnosis not present

## 2016-08-09 DIAGNOSIS — Z125 Encounter for screening for malignant neoplasm of prostate: Secondary | ICD-10-CM | POA: Diagnosis not present

## 2016-08-29 DIAGNOSIS — E784 Other hyperlipidemia: Secondary | ICD-10-CM | POA: Diagnosis not present

## 2016-08-29 DIAGNOSIS — Z79899 Other long term (current) drug therapy: Secondary | ICD-10-CM | POA: Diagnosis not present

## 2016-08-29 DIAGNOSIS — K219 Gastro-esophageal reflux disease without esophagitis: Secondary | ICD-10-CM | POA: Diagnosis not present

## 2016-08-29 DIAGNOSIS — Z1389 Encounter for screening for other disorder: Secondary | ICD-10-CM | POA: Diagnosis not present

## 2016-08-29 DIAGNOSIS — I10 Essential (primary) hypertension: Secondary | ICD-10-CM | POA: Diagnosis not present

## 2016-08-29 DIAGNOSIS — Z23 Encounter for immunization: Secondary | ICD-10-CM | POA: Diagnosis not present

## 2016-08-29 DIAGNOSIS — D631 Anemia in chronic kidney disease: Secondary | ICD-10-CM | POA: Diagnosis not present

## 2016-08-29 DIAGNOSIS — N183 Chronic kidney disease, stage 3 (moderate): Secondary | ICD-10-CM | POA: Diagnosis not present

## 2016-08-29 DIAGNOSIS — Z6831 Body mass index (BMI) 31.0-31.9, adult: Secondary | ICD-10-CM | POA: Diagnosis not present

## 2016-08-29 DIAGNOSIS — Z0000 Encounter for general adult medical examination without abnormal findings: Secondary | ICD-10-CM | POA: Diagnosis not present

## 2016-08-29 DIAGNOSIS — I129 Hypertensive chronic kidney disease with stage 1 through stage 4 chronic kidney disease, or unspecified chronic kidney disease: Secondary | ICD-10-CM | POA: Diagnosis not present

## 2016-08-29 DIAGNOSIS — M1990 Unspecified osteoarthritis, unspecified site: Secondary | ICD-10-CM | POA: Diagnosis not present

## 2016-09-07 DIAGNOSIS — Z1212 Encounter for screening for malignant neoplasm of rectum: Secondary | ICD-10-CM | POA: Diagnosis not present

## 2016-11-29 DIAGNOSIS — I129 Hypertensive chronic kidney disease with stage 1 through stage 4 chronic kidney disease, or unspecified chronic kidney disease: Secondary | ICD-10-CM | POA: Diagnosis not present

## 2016-11-29 DIAGNOSIS — N184 Chronic kidney disease, stage 4 (severe): Secondary | ICD-10-CM | POA: Diagnosis not present

## 2016-11-29 DIAGNOSIS — N398 Other specified disorders of urinary system: Secondary | ICD-10-CM | POA: Diagnosis not present

## 2016-11-29 DIAGNOSIS — N2581 Secondary hyperparathyroidism of renal origin: Secondary | ICD-10-CM | POA: Diagnosis not present

## 2016-11-29 DIAGNOSIS — E669 Obesity, unspecified: Secondary | ICD-10-CM | POA: Diagnosis not present

## 2016-11-29 DIAGNOSIS — M48061 Spinal stenosis, lumbar region without neurogenic claudication: Secondary | ICD-10-CM | POA: Diagnosis not present

## 2016-11-29 DIAGNOSIS — N189 Chronic kidney disease, unspecified: Secondary | ICD-10-CM | POA: Diagnosis not present

## 2016-11-29 DIAGNOSIS — D631 Anemia in chronic kidney disease: Secondary | ICD-10-CM | POA: Diagnosis not present

## 2016-11-29 DIAGNOSIS — M1990 Unspecified osteoarthritis, unspecified site: Secondary | ICD-10-CM | POA: Diagnosis not present

## 2017-03-16 DIAGNOSIS — Z6831 Body mass index (BMI) 31.0-31.9, adult: Secondary | ICD-10-CM | POA: Diagnosis not present

## 2017-03-16 DIAGNOSIS — M170 Bilateral primary osteoarthritis of knee: Secondary | ICD-10-CM | POA: Diagnosis not present

## 2017-03-16 DIAGNOSIS — G8929 Other chronic pain: Secondary | ICD-10-CM | POA: Diagnosis not present

## 2017-08-30 DIAGNOSIS — E7849 Other hyperlipidemia: Secondary | ICD-10-CM | POA: Diagnosis not present

## 2017-08-30 DIAGNOSIS — I10 Essential (primary) hypertension: Secondary | ICD-10-CM | POA: Diagnosis not present

## 2017-08-30 DIAGNOSIS — Z125 Encounter for screening for malignant neoplasm of prostate: Secondary | ICD-10-CM | POA: Diagnosis not present

## 2017-08-30 DIAGNOSIS — R82998 Other abnormal findings in urine: Secondary | ICD-10-CM | POA: Diagnosis not present

## 2017-08-31 DIAGNOSIS — M1990 Unspecified osteoarthritis, unspecified site: Secondary | ICD-10-CM | POA: Diagnosis not present

## 2017-08-31 DIAGNOSIS — I129 Hypertensive chronic kidney disease with stage 1 through stage 4 chronic kidney disease, or unspecified chronic kidney disease: Secondary | ICD-10-CM | POA: Diagnosis not present

## 2017-08-31 DIAGNOSIS — N184 Chronic kidney disease, stage 4 (severe): Secondary | ICD-10-CM | POA: Diagnosis not present

## 2017-08-31 DIAGNOSIS — M48061 Spinal stenosis, lumbar region without neurogenic claudication: Secondary | ICD-10-CM | POA: Diagnosis not present

## 2017-08-31 DIAGNOSIS — D631 Anemia in chronic kidney disease: Secondary | ICD-10-CM | POA: Diagnosis not present

## 2017-08-31 DIAGNOSIS — N2581 Secondary hyperparathyroidism of renal origin: Secondary | ICD-10-CM | POA: Diagnosis not present

## 2017-09-06 DIAGNOSIS — N183 Chronic kidney disease, stage 3 (moderate): Secondary | ICD-10-CM | POA: Diagnosis not present

## 2017-09-06 DIAGNOSIS — Z0000 Encounter for general adult medical examination without abnormal findings: Secondary | ICD-10-CM | POA: Diagnosis not present

## 2017-09-06 DIAGNOSIS — E7849 Other hyperlipidemia: Secondary | ICD-10-CM | POA: Diagnosis not present

## 2017-09-06 DIAGNOSIS — M170 Bilateral primary osteoarthritis of knee: Secondary | ICD-10-CM | POA: Diagnosis not present

## 2017-09-06 DIAGNOSIS — G8929 Other chronic pain: Secondary | ICD-10-CM | POA: Diagnosis not present

## 2017-09-06 DIAGNOSIS — I10 Essential (primary) hypertension: Secondary | ICD-10-CM | POA: Diagnosis not present

## 2017-09-06 DIAGNOSIS — Z6831 Body mass index (BMI) 31.0-31.9, adult: Secondary | ICD-10-CM | POA: Diagnosis not present

## 2017-09-06 DIAGNOSIS — I129 Hypertensive chronic kidney disease with stage 1 through stage 4 chronic kidney disease, or unspecified chronic kidney disease: Secondary | ICD-10-CM | POA: Diagnosis not present

## 2017-09-06 DIAGNOSIS — R972 Elevated prostate specific antigen [PSA]: Secondary | ICD-10-CM | POA: Diagnosis not present

## 2017-09-06 DIAGNOSIS — Z79899 Other long term (current) drug therapy: Secondary | ICD-10-CM | POA: Diagnosis not present

## 2017-09-06 DIAGNOSIS — D631 Anemia in chronic kidney disease: Secondary | ICD-10-CM | POA: Diagnosis not present

## 2017-09-06 DIAGNOSIS — Z1212 Encounter for screening for malignant neoplasm of rectum: Secondary | ICD-10-CM | POA: Diagnosis not present

## 2017-09-06 DIAGNOSIS — Z1389 Encounter for screening for other disorder: Secondary | ICD-10-CM | POA: Diagnosis not present

## 2017-09-13 DIAGNOSIS — H25813 Combined forms of age-related cataract, bilateral: Secondary | ICD-10-CM | POA: Diagnosis not present

## 2017-12-08 DIAGNOSIS — M79672 Pain in left foot: Secondary | ICD-10-CM | POA: Diagnosis not present

## 2018-03-05 DIAGNOSIS — M170 Bilateral primary osteoarthritis of knee: Secondary | ICD-10-CM | POA: Diagnosis not present

## 2018-03-05 DIAGNOSIS — E7849 Other hyperlipidemia: Secondary | ICD-10-CM | POA: Diagnosis not present

## 2018-03-05 DIAGNOSIS — D631 Anemia in chronic kidney disease: Secondary | ICD-10-CM | POA: Diagnosis not present

## 2018-03-05 DIAGNOSIS — M1990 Unspecified osteoarthritis, unspecified site: Secondary | ICD-10-CM | POA: Diagnosis not present

## 2018-03-05 DIAGNOSIS — Z79899 Other long term (current) drug therapy: Secondary | ICD-10-CM | POA: Diagnosis not present

## 2018-03-05 DIAGNOSIS — R972 Elevated prostate specific antigen [PSA]: Secondary | ICD-10-CM | POA: Diagnosis not present

## 2018-03-05 DIAGNOSIS — I10 Essential (primary) hypertension: Secondary | ICD-10-CM | POA: Diagnosis not present

## 2018-03-05 DIAGNOSIS — N183 Chronic kidney disease, stage 3 (moderate): Secondary | ICD-10-CM | POA: Diagnosis not present

## 2018-03-05 DIAGNOSIS — G8929 Other chronic pain: Secondary | ICD-10-CM | POA: Diagnosis not present

## 2018-03-05 DIAGNOSIS — I129 Hypertensive chronic kidney disease with stage 1 through stage 4 chronic kidney disease, or unspecified chronic kidney disease: Secondary | ICD-10-CM | POA: Diagnosis not present

## 2018-03-05 DIAGNOSIS — E669 Obesity, unspecified: Secondary | ICD-10-CM | POA: Diagnosis not present

## 2018-03-09 DIAGNOSIS — D631 Anemia in chronic kidney disease: Secondary | ICD-10-CM | POA: Diagnosis not present

## 2018-03-09 DIAGNOSIS — N398 Other specified disorders of urinary system: Secondary | ICD-10-CM | POA: Diagnosis not present

## 2018-03-09 DIAGNOSIS — N184 Chronic kidney disease, stage 4 (severe): Secondary | ICD-10-CM | POA: Diagnosis not present

## 2018-03-09 DIAGNOSIS — E669 Obesity, unspecified: Secondary | ICD-10-CM | POA: Diagnosis not present

## 2018-03-09 DIAGNOSIS — N189 Chronic kidney disease, unspecified: Secondary | ICD-10-CM | POA: Diagnosis not present

## 2018-03-09 DIAGNOSIS — I129 Hypertensive chronic kidney disease with stage 1 through stage 4 chronic kidney disease, or unspecified chronic kidney disease: Secondary | ICD-10-CM | POA: Diagnosis not present

## 2018-03-09 DIAGNOSIS — Z0000 Encounter for general adult medical examination without abnormal findings: Secondary | ICD-10-CM | POA: Diagnosis not present

## 2018-03-09 DIAGNOSIS — M1990 Unspecified osteoarthritis, unspecified site: Secondary | ICD-10-CM | POA: Diagnosis not present

## 2018-03-09 DIAGNOSIS — M48061 Spinal stenosis, lumbar region without neurogenic claudication: Secondary | ICD-10-CM | POA: Diagnosis not present

## 2018-03-09 DIAGNOSIS — N2581 Secondary hyperparathyroidism of renal origin: Secondary | ICD-10-CM | POA: Diagnosis not present

## 2018-03-12 ENCOUNTER — Emergency Department (HOSPITAL_COMMUNITY)
Admission: EM | Admit: 2018-03-12 | Discharge: 2018-03-12 | Disposition: A | Discharge status: Home / Self Care | Payer: Medicare Other | Attending: Emergency Medicine | Admitting: Emergency Medicine

## 2018-03-12 ENCOUNTER — Encounter (HOSPITAL_COMMUNITY): Payer: Self-pay | Admitting: Emergency Medicine

## 2018-03-12 ENCOUNTER — Emergency Department (HOSPITAL_COMMUNITY): Payer: Medicare Other

## 2018-03-12 DIAGNOSIS — F17220 Nicotine dependence, chewing tobacco, uncomplicated: Secondary | ICD-10-CM | POA: Diagnosis not present

## 2018-03-12 DIAGNOSIS — I129 Hypertensive chronic kidney disease with stage 1 through stage 4 chronic kidney disease, or unspecified chronic kidney disease: Secondary | ICD-10-CM | POA: Insufficient documentation

## 2018-03-12 DIAGNOSIS — R0902 Hypoxemia: Secondary | ICD-10-CM | POA: Diagnosis not present

## 2018-03-12 DIAGNOSIS — Z79899 Other long term (current) drug therapy: Secondary | ICD-10-CM | POA: Insufficient documentation

## 2018-03-12 DIAGNOSIS — R001 Bradycardia, unspecified: Secondary | ICD-10-CM | POA: Diagnosis not present

## 2018-03-12 DIAGNOSIS — N189 Chronic kidney disease, unspecified: Secondary | ICD-10-CM | POA: Insufficient documentation

## 2018-03-12 DIAGNOSIS — J984 Other disorders of lung: Secondary | ICD-10-CM | POA: Diagnosis not present

## 2018-03-12 DIAGNOSIS — R531 Weakness: Secondary | ICD-10-CM | POA: Diagnosis not present

## 2018-03-12 DIAGNOSIS — I447 Left bundle-branch block, unspecified: Secondary | ICD-10-CM | POA: Diagnosis not present

## 2018-03-12 DIAGNOSIS — R42 Dizziness and giddiness: Secondary | ICD-10-CM

## 2018-03-12 DIAGNOSIS — I959 Hypotension, unspecified: Secondary | ICD-10-CM | POA: Diagnosis not present

## 2018-03-12 LAB — CBC WITH DIFFERENTIAL/PLATELET
Abs Immature Granulocytes: 0 K/uL (ref 0.0–0.1)
Basophils Absolute: 0.1 K/uL (ref 0.0–0.1)
Basophils Relative: 1 %
Eosinophils Absolute: 0.5 K/uL (ref 0.0–0.7)
Eosinophils Relative: 7 %
HCT: 33.8 % — ABNORMAL LOW (ref 39.0–52.0)
Hemoglobin: 10.6 g/dL — ABNORMAL LOW (ref 13.0–17.0)
Immature Granulocytes: 0 %
Lymphocytes Relative: 19 %
Lymphs Abs: 1.4 K/uL (ref 0.7–4.0)
MCH: 32.4 pg (ref 26.0–34.0)
MCHC: 31.4 g/dL (ref 30.0–36.0)
MCV: 103.4 fL — ABNORMAL HIGH (ref 78.0–100.0)
Monocytes Absolute: 0.9 K/uL (ref 0.1–1.0)
Monocytes Relative: 12 %
Neutro Abs: 4.5 K/uL (ref 1.7–7.7)
Neutrophils Relative %: 61 %
Platelets: 132 K/uL — ABNORMAL LOW (ref 150–400)
RBC: 3.27 MIL/uL — ABNORMAL LOW (ref 4.22–5.81)
RDW: 13.8 % (ref 11.5–15.5)
WBC: 7.3 K/uL (ref 4.0–10.5)

## 2018-03-12 LAB — BASIC METABOLIC PANEL WITH GFR
Anion gap: 8 (ref 5–15)
BUN: 28 mg/dL — ABNORMAL HIGH (ref 8–23)
CO2: 25 mmol/L (ref 22–32)
Calcium: 10.4 mg/dL — ABNORMAL HIGH (ref 8.9–10.3)
Chloride: 110 mmol/L (ref 98–111)
Creatinine, Ser: 1.63 mg/dL — ABNORMAL HIGH (ref 0.61–1.24)
GFR calc Af Amer: 42 mL/min — ABNORMAL LOW
GFR calc non Af Amer: 37 mL/min — ABNORMAL LOW
Glucose, Bld: 91 mg/dL (ref 70–99)
Potassium: 4.6 mmol/L (ref 3.5–5.1)
Sodium: 143 mmol/L (ref 135–145)

## 2018-03-12 LAB — URINALYSIS, ROUTINE W REFLEX MICROSCOPIC
Bilirubin Urine: NEGATIVE
Glucose, UA: NEGATIVE mg/dL
Hgb urine dipstick: NEGATIVE
Ketones, ur: NEGATIVE mg/dL
Leukocytes, UA: NEGATIVE
Nitrite: NEGATIVE
Protein, ur: NEGATIVE mg/dL
Specific Gravity, Urine: 1.011 (ref 1.005–1.030)
pH: 5 (ref 5.0–8.0)

## 2018-03-12 LAB — I-STAT TROPONIN, ED: Troponin i, poc: 0.02 ng/mL (ref 0.00–0.08)

## 2018-03-12 MED ORDER — SODIUM CHLORIDE 0.9 % IV BOLUS
1000.0000 mL | Freq: Once | INTRAVENOUS | Status: AC
  Administered 2018-03-12: 1000 mL via INTRAVENOUS

## 2018-03-12 NOTE — ED Provider Notes (Signed)
[facility] Provider Note   CSN: [id number] Arrival date & time: 03/12/18  0940     History   Chief Complaint Chief Complaint  Patient presents with  . Bradycardia    HPI Nathaniel [name] is a 84 y.o. male.  He states he woke up this morning when he was getting out of bed he felt dizzy/lightheaded.  He states this lasted for a few minutes and was improved with rest.  He told his son that he did not feel well enough to drive the tractor today which is very unusual for him and so they had fire department come out and take a look at him.  He was found to be bradycardic and they recommended he come here to be seen.  Currently he denies any complaints.  No chest pain no shortness of breath no nausea no vomiting no diarrhea.  No headache blurry vision double vision.  No weakness or numbness.  States he had a appointment with his PCP last week and everything was fine and they did not change any medications.  The history is provided by the patient and a relative.  Dizziness  Quality:  Lightheadedness Severity:  Moderate Onset quality:  Sudden Progression:  Resolved Chronicity:  New Context: standing up   Relieved by:  Lying down Associated symptoms: no blood in stool, no chest pain, no diarrhea, no headaches, no nausea, no shortness of breath, no syncope, no vision changes and no vomiting     Past Medical History:  Diagnosis Date  . Arthritis   . Chronic kidney disease   . Hypertension    sees  [place]  . Kidney stones    15 yrs ago.    There are no active problems to display for this patient.   Past Surgical History:  Procedure Laterality Date  . JOINT REPLACEMENT     hip, knee   . ROTATOR CUFF REPAIR     right shoulder  . TONSILLECTOMY    . TOTAL HIP ARTHROPLASTY    . TOTAL KNEE ARTHROPLASTY          Home Medications    Prior to Admission medications   Medication Sig Start Date End Date Taking? Authorizing Provider    furosemide (LASIX) 20 MG tablet Take 1 tablet (20 mg total) by mouth every morning. 11/09/12   Judeth Cornfield, NP  HYDROcodone-acetaminophen (NORCO/VICODIN) 5-325 MG per tablet Take 1 tablet by mouth every 8 (eight) hours as needed. 11/09/12   Judeth Cornfield, NP  losartan (COZAAR) 100 MG tablet Take 1 tablet (100 mg total) by mouth daily. 11/09/12   Judeth Cornfield, NP  tamsulosin (FLOMAX) 0.4 MG CAPS Take 1 capsule (0.4 mg total) by mouth daily after breakfast. 11/09/12   Judeth Cornfield, NP    Family History History reviewed. No pertinent family history.  Social History Social History   Tobacco Use  . Smoking status: Former Smoker    Last attempt to quit: 08/02/1991    Years since quitting: 26.6  . Smokeless tobacco: Current User    Types: Chew  Substance Use Topics  . Alcohol use: No  . Drug use: No     Allergies   Patient has no known allergies.   Review of Systems Review of Systems  Constitutional: Negative for fever.  HENT: Negative for sore throat.   Eyes: Negative for visual disturbance.  Respiratory: Negative for shortness of breath.   Cardiovascular: Negative for chest pain and syncope.  Gastrointestinal: Negative for  abdominal pain, blood in stool, diarrhea, nausea and vomiting.  Genitourinary: Negative for dysuria.  Musculoskeletal: Negative for neck pain.  Skin: Negative for rash.  Neurological: Positive for dizziness. Negative for headaches.     Physical Exam Updated Vital Signs There were no vitals taken for this visit.  Physical Exam  Constitutional: He appears well-developed and well-nourished.  HENT:  Head: Normocephalic and atraumatic.  Eyes: Conjunctivae are normal.  Neck: Neck supple.  Cardiovascular: Regular rhythm, normal heart sounds and intact distal pulses. Bradycardia present.  Pulmonary/Chest: Effort normal and breath sounds normal. He has no wheezes. He has no rales.  Abdominal: Soft. There is no tenderness. There is no guarding.   Musculoskeletal: Normal range of motion. He exhibits no tenderness.  Neurological: He is alert. He has normal strength. No sensory deficit. GCS eye subscore is 4. GCS verbal subscore is 5. GCS motor subscore is 6.  Skin: Skin is warm and dry.  Psychiatric: He has a normal mood and affect.  Nursing note and vitals reviewed.    ED Treatments / Results  Labs (all labs ordered are listed, but only abnormal results are displayed) Labs Reviewed  CBC WITH DIFFERENTIAL/PLATELET - Abnormal; Notable for the following components:      Result Value   RBC 3.27 (*)    Hemoglobin 10.6 (*)    HCT 33.8 (*)    MCV 103.4 (*)    Platelets 132 (*)    All other components within normal limits  BASIC METABOLIC PANEL - Abnormal; Notable for the following components:   BUN 28 (*)    Creatinine, Ser 1.63 (*)    Calcium 10.4 (*)    GFR calc non Af Amer 37 (*)    GFR calc Af Amer 42 (*)    All other components within normal limits  URINALYSIS, ROUTINE W REFLEX MICROSCOPIC  I-STAT TROPONIN, ED    EKG EKG Interpretation  Date/Time:  Monday March 12 2018 09:53:14 EDT Ventricular Rate:  55 PR Interval:    QRS Duration: 151 QT Interval:  439 QTC Calculation: 420 R Axis:   -51 Text Interpretation:  Sinus rhythm Left bundle branch block similar to prior 5/13 Confirmed by Aletta Edouard [phone]) on 03/12/2018 10:01:58 AM   Radiology Dg Chest 2 View  Result Date: 03/12/2018 CLINICAL DATA:  Dizziness and weakness. EXAM: CHEST - 2 VIEW COMPARISON:  Chest x-ray dated October 03, 2012. FINDINGS: Moderate cardiomegaly, progressed when compared to prior study. Normal pulmonary vascularity. Low lung volumes are present, causing crowding of the pulmonary vasculature. Unchanged elevation of the right hemidiaphragm and interposition of the colon. Unchanged bibasilar scarring. No focal consolidation, pleural effusion, or pneumothorax. No acute osseous abnormality. Prior lumbar fusion. Severe degenerative changes of  the bilateral shoulders. IMPRESSION: 1. No active cardiopulmonary disease. 2. Progressed moderate cardiomegaly.  Unchanged bibasilar scarring. Electronically Signed   By: Titus Dubin M.D.   On: 03/12/2018 10:32    Procedures Procedures (including critical care time)  Medications Ordered in ED Medications  sodium chloride 0.9 % bolus 1,000 mL (has no administration in time range)     Initial Impression / Assessment and Plan / ED Course  I have reviewed the triage vital signs and the nursing notes.  Pertinent labs & imaging results that were available during my care of the patient were reviewed by me and considered in my medical decision making (see chart for details).  Clinical Course as of Mar 12 1725  Mon Mar 12, 4114  1562 84 year old male  here with feeling lightheaded and dizzy upon getting up this morning.  He has a benign exam here.  Is getting lab work EKG chest x-ray and getting some IV fluids.   [MB]  1202 Patient ambulated here at his baseline per his son.  His creatinine is slightly above his baseline and would not surprise me that he is dehydrated.  He states he feels better after getting a liter of fluid and wants to go home.  He understands he needs to follow-up with his regular doctor and return if any worsening symptoms.   [MB]    Clinical Course User Index [MB] Hayden Rasmussen, MD     Final Clinical Impressions(s) / ED Diagnoses   Final diagnoses:  Orthostatic lightheadedness  Bradycardia    ED Discharge Orders    None       Hayden Rasmussen, MD 03/12/18 1726

## 2018-03-12 NOTE — ED Triage Notes (Signed)
Pt here from home with c/o not feeling well and was found to be bradycardiac but that may be his normal , is c/o dizziness and weakness

## 2018-03-12 NOTE — ED Notes (Signed)
Patient transported to X-ray 

## 2018-03-12 NOTE — ED Notes (Signed)
Pt able to ambulate with walker  

## 2018-03-12 NOTE — Discharge Instructions (Addendum)
Your evaluated in the emergency department after an episode of feeling lightheaded this morning.  Your symptoms were improved when you got here.  We ran some tests and your renal function is slightly worse and this may be related to dehydration.  Please follow-up with your doctor for repeat evaluation and blood work and return if any worsening symptoms.

## 2018-04-04 DIAGNOSIS — R05 Cough: Secondary | ICD-10-CM | POA: Diagnosis not present

## 2018-04-04 DIAGNOSIS — J189 Pneumonia, unspecified organism: Secondary | ICD-10-CM | POA: Diagnosis not present

## 2018-06-04 DIAGNOSIS — M1990 Unspecified osteoarthritis, unspecified site: Secondary | ICD-10-CM | POA: Diagnosis not present

## 2018-06-04 DIAGNOSIS — G8929 Other chronic pain: Secondary | ICD-10-CM | POA: Diagnosis not present

## 2018-06-04 DIAGNOSIS — I10 Essential (primary) hypertension: Secondary | ICD-10-CM | POA: Diagnosis not present

## 2018-06-04 DIAGNOSIS — Z6831 Body mass index (BMI) 31.0-31.9, adult: Secondary | ICD-10-CM | POA: Diagnosis not present

## 2018-06-04 DIAGNOSIS — M170 Bilateral primary osteoarthritis of knee: Secondary | ICD-10-CM | POA: Diagnosis not present

## 2018-06-04 DIAGNOSIS — Z23 Encounter for immunization: Secondary | ICD-10-CM | POA: Diagnosis not present

## 2018-09-10 DIAGNOSIS — D631 Anemia in chronic kidney disease: Secondary | ICD-10-CM | POA: Diagnosis not present

## 2018-09-10 DIAGNOSIS — K219 Gastro-esophageal reflux disease without esophagitis: Secondary | ICD-10-CM | POA: Diagnosis not present

## 2018-09-10 DIAGNOSIS — R972 Elevated prostate specific antigen [PSA]: Secondary | ICD-10-CM | POA: Diagnosis not present

## 2018-09-10 DIAGNOSIS — G8929 Other chronic pain: Secondary | ICD-10-CM | POA: Diagnosis not present

## 2018-09-10 DIAGNOSIS — Z1331 Encounter for screening for depression: Secondary | ICD-10-CM | POA: Diagnosis not present

## 2018-09-10 DIAGNOSIS — M170 Bilateral primary osteoarthritis of knee: Secondary | ICD-10-CM | POA: Diagnosis not present

## 2018-09-10 DIAGNOSIS — E7849 Other hyperlipidemia: Secondary | ICD-10-CM | POA: Diagnosis not present

## 2018-09-10 DIAGNOSIS — N183 Chronic kidney disease, stage 3 (moderate): Secondary | ICD-10-CM | POA: Diagnosis not present

## 2018-09-10 DIAGNOSIS — I129 Hypertensive chronic kidney disease with stage 1 through stage 4 chronic kidney disease, or unspecified chronic kidney disease: Secondary | ICD-10-CM | POA: Diagnosis not present

## 2018-09-10 DIAGNOSIS — M1990 Unspecified osteoarthritis, unspecified site: Secondary | ICD-10-CM | POA: Diagnosis not present

## 2018-09-10 DIAGNOSIS — R82998 Other abnormal findings in urine: Secondary | ICD-10-CM | POA: Diagnosis not present

## 2018-09-10 DIAGNOSIS — Z0000 Encounter for general adult medical examination without abnormal findings: Secondary | ICD-10-CM | POA: Diagnosis not present

## 2018-09-10 DIAGNOSIS — Z125 Encounter for screening for malignant neoplasm of prostate: Secondary | ICD-10-CM | POA: Diagnosis not present

## 2018-09-26 DIAGNOSIS — N184 Chronic kidney disease, stage 4 (severe): Secondary | ICD-10-CM | POA: Diagnosis not present

## 2018-09-26 DIAGNOSIS — I129 Hypertensive chronic kidney disease with stage 1 through stage 4 chronic kidney disease, or unspecified chronic kidney disease: Secondary | ICD-10-CM | POA: Diagnosis not present

## 2018-09-26 DIAGNOSIS — N2581 Secondary hyperparathyroidism of renal origin: Secondary | ICD-10-CM | POA: Diagnosis not present

## 2018-09-26 DIAGNOSIS — E669 Obesity, unspecified: Secondary | ICD-10-CM | POA: Diagnosis not present

## 2018-09-26 DIAGNOSIS — M1990 Unspecified osteoarthritis, unspecified site: Secondary | ICD-10-CM | POA: Diagnosis not present

## 2018-09-26 DIAGNOSIS — D631 Anemia in chronic kidney disease: Secondary | ICD-10-CM | POA: Diagnosis not present

## 2018-09-26 DIAGNOSIS — M48061 Spinal stenosis, lumbar region without neurogenic claudication: Secondary | ICD-10-CM | POA: Diagnosis not present

## 2018-10-15 DIAGNOSIS — N184 Chronic kidney disease, stage 4 (severe): Secondary | ICD-10-CM | POA: Diagnosis not present

## 2018-12-10 DIAGNOSIS — N183 Chronic kidney disease, stage 3 (moderate): Secondary | ICD-10-CM | POA: Diagnosis not present

## 2018-12-10 DIAGNOSIS — G8929 Other chronic pain: Secondary | ICD-10-CM | POA: Diagnosis not present

## 2018-12-10 DIAGNOSIS — M1990 Unspecified osteoarthritis, unspecified site: Secondary | ICD-10-CM | POA: Diagnosis not present

## 2018-12-10 DIAGNOSIS — E669 Obesity, unspecified: Secondary | ICD-10-CM | POA: Diagnosis not present

## 2018-12-10 DIAGNOSIS — I129 Hypertensive chronic kidney disease with stage 1 through stage 4 chronic kidney disease, or unspecified chronic kidney disease: Secondary | ICD-10-CM | POA: Diagnosis not present

## 2019-03-11 DIAGNOSIS — I10 Essential (primary) hypertension: Secondary | ICD-10-CM | POA: Diagnosis not present

## 2019-03-11 DIAGNOSIS — N183 Chronic kidney disease, stage 3 (moderate): Secondary | ICD-10-CM | POA: Diagnosis not present

## 2019-03-11 DIAGNOSIS — G8929 Other chronic pain: Secondary | ICD-10-CM | POA: Diagnosis not present

## 2019-03-11 DIAGNOSIS — E785 Hyperlipidemia, unspecified: Secondary | ICD-10-CM | POA: Diagnosis not present

## 2019-03-11 DIAGNOSIS — R972 Elevated prostate specific antigen [PSA]: Secondary | ICD-10-CM | POA: Diagnosis not present

## 2019-03-11 DIAGNOSIS — D631 Anemia in chronic kidney disease: Secondary | ICD-10-CM | POA: Diagnosis not present

## 2019-03-11 DIAGNOSIS — M170 Bilateral primary osteoarthritis of knee: Secondary | ICD-10-CM | POA: Diagnosis not present

## 2019-03-11 DIAGNOSIS — Z79899 Other long term (current) drug therapy: Secondary | ICD-10-CM | POA: Diagnosis not present

## 2019-03-11 DIAGNOSIS — M1990 Unspecified osteoarthritis, unspecified site: Secondary | ICD-10-CM | POA: Diagnosis not present

## 2019-03-11 DIAGNOSIS — I129 Hypertensive chronic kidney disease with stage 1 through stage 4 chronic kidney disease, or unspecified chronic kidney disease: Secondary | ICD-10-CM | POA: Diagnosis not present

## 2019-03-11 DIAGNOSIS — K219 Gastro-esophageal reflux disease without esophagitis: Secondary | ICD-10-CM | POA: Diagnosis not present

## 2019-03-11 DIAGNOSIS — L299 Pruritus, unspecified: Secondary | ICD-10-CM | POA: Diagnosis not present

## 2019-04-10 DIAGNOSIS — N184 Chronic kidney disease, stage 4 (severe): Secondary | ICD-10-CM | POA: Diagnosis not present

## 2019-04-15 DIAGNOSIS — N2581 Secondary hyperparathyroidism of renal origin: Secondary | ICD-10-CM | POA: Diagnosis not present

## 2019-04-15 DIAGNOSIS — N184 Chronic kidney disease, stage 4 (severe): Secondary | ICD-10-CM | POA: Diagnosis not present

## 2019-04-15 DIAGNOSIS — I129 Hypertensive chronic kidney disease with stage 1 through stage 4 chronic kidney disease, or unspecified chronic kidney disease: Secondary | ICD-10-CM | POA: Diagnosis not present

## 2019-04-15 DIAGNOSIS — D631 Anemia in chronic kidney disease: Secondary | ICD-10-CM | POA: Diagnosis not present

## 2019-05-29 DIAGNOSIS — M722 Plantar fascial fibromatosis: Secondary | ICD-10-CM | POA: Diagnosis not present

## 2019-05-29 DIAGNOSIS — M79671 Pain in right foot: Secondary | ICD-10-CM | POA: Diagnosis not present

## 2019-06-24 DIAGNOSIS — Z23 Encounter for immunization: Secondary | ICD-10-CM | POA: Diagnosis not present

## 2019-08-05 ENCOUNTER — Ambulatory Visit: Payer: Medicare Other | Attending: Internal Medicine

## 2019-09-11 DIAGNOSIS — E7849 Other hyperlipidemia: Secondary | ICD-10-CM | POA: Diagnosis not present

## 2019-09-11 DIAGNOSIS — Z125 Encounter for screening for malignant neoplasm of prostate: Secondary | ICD-10-CM | POA: Diagnosis not present

## 2019-09-12 DIAGNOSIS — I10 Essential (primary) hypertension: Secondary | ICD-10-CM | POA: Diagnosis not present

## 2019-09-12 DIAGNOSIS — R82998 Other abnormal findings in urine: Secondary | ICD-10-CM | POA: Diagnosis not present

## 2019-09-18 DIAGNOSIS — I10 Essential (primary) hypertension: Secondary | ICD-10-CM | POA: Diagnosis not present

## 2019-09-18 DIAGNOSIS — E669 Obesity, unspecified: Secondary | ICD-10-CM | POA: Diagnosis not present

## 2019-09-18 DIAGNOSIS — E785 Hyperlipidemia, unspecified: Secondary | ICD-10-CM | POA: Diagnosis not present

## 2019-09-18 DIAGNOSIS — G8929 Other chronic pain: Secondary | ICD-10-CM | POA: Diagnosis not present

## 2019-09-18 DIAGNOSIS — I129 Hypertensive chronic kidney disease with stage 1 through stage 4 chronic kidney disease, or unspecified chronic kidney disease: Secondary | ICD-10-CM | POA: Diagnosis not present

## 2019-09-18 DIAGNOSIS — D631 Anemia in chronic kidney disease: Secondary | ICD-10-CM | POA: Diagnosis not present

## 2019-09-18 DIAGNOSIS — K219 Gastro-esophageal reflux disease without esophagitis: Secondary | ICD-10-CM | POA: Diagnosis not present

## 2019-09-18 DIAGNOSIS — Z0000 Encounter for general adult medical examination without abnormal findings: Secondary | ICD-10-CM | POA: Diagnosis not present

## 2019-09-18 DIAGNOSIS — M170 Bilateral primary osteoarthritis of knee: Secondary | ICD-10-CM | POA: Diagnosis not present

## 2019-12-01 ENCOUNTER — Emergency Department (HOSPITAL_COMMUNITY): Payer: Medicare HMO

## 2019-12-01 ENCOUNTER — Emergency Department (HOSPITAL_COMMUNITY)
Admission: EM | Admit: 2019-12-01 | Discharge: 2019-12-01 | Disposition: A | Discharge status: Home / Self Care | Payer: Medicare HMO | Attending: Emergency Medicine | Admitting: Emergency Medicine

## 2019-12-01 DIAGNOSIS — Y792 Prosthetic and other implants, materials and accessory orthopedic devices associated with adverse incidents: Secondary | ICD-10-CM | POA: Diagnosis not present

## 2019-12-01 DIAGNOSIS — I129 Hypertensive chronic kidney disease with stage 1 through stage 4 chronic kidney disease, or unspecified chronic kidney disease: Secondary | ICD-10-CM | POA: Diagnosis not present

## 2019-12-01 DIAGNOSIS — T84021A Dislocation of internal left hip prosthesis, initial encounter: Secondary | ICD-10-CM | POA: Insufficient documentation

## 2019-12-01 DIAGNOSIS — Y929 Unspecified place or not applicable: Secondary | ICD-10-CM | POA: Insufficient documentation

## 2019-12-01 DIAGNOSIS — S299XXA Unspecified injury of thorax, initial encounter: Secondary | ICD-10-CM | POA: Diagnosis not present

## 2019-12-01 DIAGNOSIS — R52 Pain, unspecified: Secondary | ICD-10-CM | POA: Diagnosis not present

## 2019-12-01 DIAGNOSIS — Y999 Unspecified external cause status: Secondary | ICD-10-CM | POA: Diagnosis not present

## 2019-12-01 DIAGNOSIS — Z20822 Contact with and (suspected) exposure to covid-19: Secondary | ICD-10-CM | POA: Diagnosis not present

## 2019-12-01 DIAGNOSIS — Z87891 Personal history of nicotine dependence: Secondary | ICD-10-CM | POA: Insufficient documentation

## 2019-12-01 DIAGNOSIS — S73005A Unspecified dislocation of left hip, initial encounter: Secondary | ICD-10-CM | POA: Diagnosis not present

## 2019-12-01 DIAGNOSIS — Y93E1 Activity, personal bathing and showering: Secondary | ICD-10-CM | POA: Insufficient documentation

## 2019-12-01 DIAGNOSIS — Z79899 Other long term (current) drug therapy: Secondary | ICD-10-CM | POA: Insufficient documentation

## 2019-12-01 DIAGNOSIS — W010XXA Fall on same level from slipping, tripping and stumbling without subsequent striking against object, initial encounter: Secondary | ICD-10-CM | POA: Insufficient documentation

## 2019-12-01 DIAGNOSIS — N189 Chronic kidney disease, unspecified: Secondary | ICD-10-CM | POA: Insufficient documentation

## 2019-12-01 DIAGNOSIS — S73005D Unspecified dislocation of left hip, subsequent encounter: Secondary | ICD-10-CM | POA: Diagnosis not present

## 2019-12-01 DIAGNOSIS — W19XXXA Unspecified fall, initial encounter: Secondary | ICD-10-CM | POA: Diagnosis not present

## 2019-12-01 DIAGNOSIS — R0902 Hypoxemia: Secondary | ICD-10-CM | POA: Diagnosis not present

## 2019-12-01 DIAGNOSIS — R079 Chest pain, unspecified: Secondary | ICD-10-CM | POA: Diagnosis not present

## 2019-12-01 DIAGNOSIS — M25552 Pain in left hip: Secondary | ICD-10-CM | POA: Diagnosis not present

## 2019-12-01 DIAGNOSIS — I959 Hypotension, unspecified: Secondary | ICD-10-CM | POA: Diagnosis not present

## 2019-12-01 LAB — CBC WITH DIFFERENTIAL/PLATELET
Abs Immature Granulocytes: 0.03 10*3/uL (ref 0.00–0.07)
Basophils Absolute: 0 10*3/uL (ref 0.0–0.1)
Basophils Relative: 0 %
Eosinophils Absolute: 0 10*3/uL (ref 0.0–0.5)
Eosinophils Relative: 0 %
HCT: 34.3 % — ABNORMAL LOW (ref 39.0–52.0)
Hemoglobin: 10.8 g/dL — ABNORMAL LOW (ref 13.0–17.0)
Immature Granulocytes: 0 %
Lymphocytes Relative: 7 %
Lymphs Abs: 0.7 10*3/uL (ref 0.7–4.0)
MCH: 32.8 pg (ref 26.0–34.0)
MCHC: 31.5 g/dL (ref 30.0–36.0)
MCV: 104.3 fL — ABNORMAL HIGH (ref 80.0–100.0)
Monocytes Absolute: 0.6 10*3/uL (ref 0.1–1.0)
Monocytes Relative: 6 %
Neutro Abs: 8.2 10*3/uL — ABNORMAL HIGH (ref 1.7–7.7)
Neutrophils Relative %: 87 %
Platelets: 168 10*3/uL (ref 150–400)
RBC: 3.29 MIL/uL — ABNORMAL LOW (ref 4.22–5.81)
RDW: 13.7 % (ref 11.5–15.5)
WBC: 9.5 10*3/uL (ref 4.0–10.5)
nRBC: 0 % (ref 0.0–0.2)

## 2019-12-01 LAB — BASIC METABOLIC PANEL
Anion gap: 10 (ref 5–15)
BUN: 28 mg/dL — ABNORMAL HIGH (ref 8–23)
CO2: 24 mmol/L (ref 22–32)
Calcium: 10.3 mg/dL (ref 8.9–10.3)
Chloride: 109 mmol/L (ref 98–111)
Creatinine, Ser: 1.57 mg/dL — ABNORMAL HIGH (ref 0.61–1.24)
GFR calc Af Amer: 45 mL/min — ABNORMAL LOW (ref >60)
GFR calc non Af Amer: 39 mL/min — ABNORMAL LOW (ref >60)
Glucose, Bld: 111 mg/dL — ABNORMAL HIGH (ref 70–99)
Potassium: 4.6 mmol/L (ref 3.5–5.1)
Sodium: 143 mmol/L (ref 135–145)

## 2019-12-01 LAB — RESPIRATORY PANEL BY RT PCR (FLU A&B, COVID)
Influenza A by PCR: NEGATIVE
Influenza B by PCR: NEGATIVE
SARS Coronavirus 2 by RT PCR: NEGATIVE

## 2019-12-01 MED ORDER — PROPOFOL 10 MG/ML IV BOLUS
INTRAVENOUS | Status: AC | PRN
  Administered 2019-12-01: 50 mg via INTRAVENOUS

## 2019-12-01 MED ORDER — ONDANSETRON HCL 4 MG/2ML IJ SOLN
4.0000 mg | Freq: Once | INTRAMUSCULAR | Status: DC
  Filled 2019-12-01: qty 2

## 2019-12-01 MED ORDER — SODIUM CHLORIDE 0.9 % IV BOLUS
1000.0000 mL | Freq: Once | INTRAVENOUS | Status: AC
  Administered 2019-12-01: 1000 mL via INTRAVENOUS

## 2019-12-01 MED ORDER — FENTANYL CITRATE (PF) 100 MCG/2ML IJ SOLN
25.0000 ug | Freq: Once | INTRAMUSCULAR | Status: DC
  Filled 2019-12-01: qty 2

## 2019-12-01 MED ORDER — PROPOFOL 10 MG/ML IV BOLUS
1.0000 mg/kg | Freq: Once | INTRAVENOUS | Status: DC
  Filled 2019-12-01: qty 20

## 2019-12-01 NOTE — ED Triage Notes (Signed)
Pt bib ems, reporting that the pt had a mechanical fall after getting out of the shower. Pt arrived aox4 with obvious shortening of the left leg.   110/78 88sinus 16rr 99%o2 3L

## 2019-12-01 NOTE — ED Provider Notes (Signed)
Medical screening examination/treatment/procedure(s) were conducted as a shared visit with non-physician practitioner(s) and myself.  I personally evaluated the patient during the encounter. Briefly, the patient is a 86 y.o. male with history of hypertension who presents to the ED after mechanical fall.  Patient states that he was getting out of the shower and his left knee buckled and his hip popped out.  Landed on his buttocks.  Did not hit his head or lose consciousness.  He is neurologically intact.  Neurovascularly intact in the left lower extremity but it is shortening and likely dislocated his prosthesis.  This was confirmed with x-ray.  Patient is okay with attempting sedation and reduction in the ED.  This was confirmed with orthopedic group on-call for his orthopedic team.  Patient states no problems with anesthesia in the past.  No issues with intubation in the past.  Risks and benefits were discussed and patient would like to go ahead with procedure.  Patient with successful reduction and sedation.  Anticipate discharge with orthopedic follow-up with Dr. Rhona Raider.  .Sedation  Date/Time: 12/01/2019 6:17 PM Performed by: Lennice Sites, DO Authorized by: Lennice Sites, DO   Consent:    Consent obtained:  Verbal   Consent given by:  Patient   Risks discussed:  Allergic reaction, dysrhythmia, inadequate sedation, nausea, prolonged hypoxia resulting in organ damage, prolonged sedation necessitating reversal, respiratory compromise necessitating ventilatory assistance and intubation and vomiting   Alternatives discussed:  Analgesia without sedation, anxiolysis and regional anesthesia Universal protocol:    Procedure explained and questions answered to patient or proxy's satisfaction: yes     Relevant documents present and verified: yes     Test results available and properly labeled: yes     Imaging studies available: yes     Required blood products, implants, devices, and special equipment  available: yes     Site/side marked: yes     Immediately prior to procedure a time out was called: yes     Patient identity confirmation method:  Verbally with patient Indications:    Procedure performed:  Dislocation reduction   Procedure necessitating sedation performed by:  Physician performing sedation Pre-sedation assessment:    Time since last food or drink:  8 hours   ASA classification: class 2 - patient with mild systemic disease     Neck mobility: normal     Mouth opening:  3 or more finger widths   Thyromental distance:  4 finger widths   Mallampati score:  II - soft palate, uvula, fauces visible   Pre-sedation assessments completed and reviewed: airway patency, cardiovascular function, hydration status, mental status, nausea/vomiting, pain level, respiratory function and temperature     Pre-sedation assessment completed:  12/01/2019 6:17 PM Immediate pre-procedure details:    Reassessment: Patient reassessed immediately prior to procedure     Reviewed: vital signs, relevant labs/tests and NPO status     Verified: bag valve mask available, emergency equipment available, intubation equipment available, IV patency confirmed, oxygen available and suction available   Procedure details (see MAR for exact dosages):    Preoxygenation:  Nasal cannula   Sedation:  Propofol   Intended level of sedation: deep   Intra-procedure monitoring:  Blood pressure monitoring, cardiac monitor, continuous pulse oximetry, frequent LOC assessments, frequent vital sign checks and continuous capnometry   Intra-procedure events: none     Total Provider sedation time (minutes):  22 Post-procedure details:    Post-sedation assessment completed:  12/01/2019 8:30 PM   Attendance: Constant attendance by certified  staff until patient recovered     Recovery: Patient returned to pre-procedure baseline     Post-sedation assessments completed and reviewed: airway patency, cardiovascular function, hydration status,  mental status, nausea/vomiting, pain level, respiratory function and temperature     Patient is stable for discharge or admission: yes     Patient tolerance:  Tolerated well, no immediate complications      EKG Interpretation None           Lennice Sites, DO 12/01/19 2030

## 2019-12-01 NOTE — Progress Notes (Signed)
Orthopedic Tech Progress Note Patient Details:  Nathaniel [name] 27-Aug-1929 [id number]  Ortho Devices Type of Ortho Device: Knee Immobilizer Ortho Device/Splint Location: LLE Ortho Device/Splint Interventions: Ordered, Application   Post Interventions Patient Tolerated: Well Instructions Provided: [facility] [place] 12/01/2019, 7:34 PM

## 2019-12-01 NOTE — Discharge Instructions (Signed)
You have been diagnosed today with Fall and hip dislocation.  At this time there does not appear to be the presence of an emergent medical condition, however there is always the potential for conditions to change. Please read and follow the below instructions.  Please return to the Emergency Department immediately for any new or worsening symptoms. Please be sure to follow up with your Primary Care Provider within one week regarding your visit today; please call their office to schedule an appointment even if you are feeling better for a follow-up visit. Call Dr. Rhona Raider tomorrow to schedule a follow-up appointment for reevaluation.  Please continue using your walker and continue wearing the knee brace to protect your leg.  Do not flex at the hip as to avoid redislocation.  Get help right away if you: Feel like your hip has dislocated again. Have very bad pain in your hip or groin. You have chest pain or trouble breathing You have abdominal pain, nausea or vomiting Have numbness or weakness in your leg. Cannot move any part of your hip or leg. You have any new/concerning or worsening of symptoms  Please read the additional information packets attached to your discharge summary.  Do not take your medicine if  develop an itchy rash, swelling in your mouth or lips, or difficulty breathing; call 911 and seek immediate emergency medical attention if this occurs.  Note: Portions of this text may have been transcribed using voice recognition software. Every effort was made to ensure accuracy; however, inadvertent computerized transcription errors may still be present.

## 2019-12-01 NOTE — ED Provider Notes (Signed)
[facility] EMERGENCY DEPARTMENT Provider Note   CSN: [id number] Arrival date & time: 12/01/19  1644     History Chief Complaint  Patient presents with  . Fall    Nathaniel [name] is a 86 y.o. male history CKD, hypertension, kidney stones, hip replacement.  Patient presents today after fall, he was stepping out of the shower when his knee gave out causing him to fall back onto his left hip, reports immediate onset severe aching pain constant worsened with movement no alleviating factors.  He reports that his knee giving out is a common problem for him he denies any knee pain today.    Denies any head injury, headache, nausea/vomiting, loss of consciousness, blood thinner use, neck pain, back pain, chest pain, abdominal pain or pain of his other 3 extremities.  Denies any numbness/weakness, tingling.  He feels that his left hip is dislocated.   HPI     Past Medical History:  Diagnosis Date  . Arthritis   . Chronic kidney disease   . Hypertension    sees  [place]  . Kidney stones    15 yrs ago.    There are no problems to display for this patient.   Past Surgical History:  Procedure Laterality Date  . JOINT REPLACEMENT     hip, knee   . ROTATOR CUFF REPAIR     right shoulder  . TONSILLECTOMY    . TOTAL HIP ARTHROPLASTY    . TOTAL KNEE ARTHROPLASTY         No family history on file.  Social History   Tobacco Use  . Smoking status: Former Smoker    Quit date: 08/02/1991    Years since quitting: 28.3  . Smokeless tobacco: Current User    Types: Chew  Substance Use Topics  . Alcohol use: No  . Drug use: No    Home Medications Prior to Admission medications   Medication Sig Start Date End Date Taking? Authorizing Provider  furosemide (LASIX) 20 MG tablet Take 1 tablet (20 mg total) by mouth every morning. 11/09/12   Judeth Cornfield, NP  HYDROcodone-acetaminophen (NORCO/VICODIN) 5-325 MG per tablet Take 1 tablet by mouth every 8 (eight) hours as  needed. 11/09/12   Judeth Cornfield, NP  losartan (COZAAR) 100 MG tablet Take 1 tablet (100 mg total) by mouth daily. 11/09/12   Judeth Cornfield, NP  tamsulosin (FLOMAX) 0.4 MG CAPS Take 1 capsule (0.4 mg total) by mouth daily after breakfast. 11/09/12   Judeth Cornfield, NP    Allergies    Patient has no known allergies.  Review of Systems   Review of Systems Ten systems are reviewed and are negative for acute change except as noted in the HPI  Physical Exam Updated Vital Signs BP (!) 143/90 (BP Location: Right Arm)   Pulse 77   Temp 98.5 F (36.9 C) (Oral)   Resp (!) 21   Ht 6\' 1"  (1.854 m)   Wt 106.6 kg   SpO2 100%   BMI 31.00 kg/m   Physical Exam Constitutional:      General: He is not in acute distress.    Appearance: Normal appearance. He is well-developed. He is obese. He is not ill-appearing or diaphoretic.  HENT:     Head: Normocephalic and atraumatic. No raccoon eyes, Battle's sign or contusion.     Jaw: There is normal jaw occlusion.     Right Ear: External ear normal.     Left Ear: External ear normal.  Nose: Nose normal.     Mouth/Throat:     Mouth: Mucous membranes are moist.     Pharynx: Oropharynx is clear.  Eyes:     General: Vision grossly intact. Gaze aligned appropriately.     Extraocular Movements: Extraocular movements intact.     Conjunctiva/sclera: Conjunctivae normal.     Pupils: Pupils are equal, round, and reactive to light.     Comments: No pain with extraocular motion Visual fields grossly intact bilaterally  Neck:     Trachea: Trachea and phonation normal. No tracheal deviation.  Cardiovascular:     Pulses:          Dorsalis pedis pulses are 1+ on the right side and 1+ on the left side.       Posterior tibial pulses are 1+ on the right side and 1+ on the left side.  Pulmonary:     Effort: Pulmonary effort is normal. No respiratory distress.  Abdominal:     General: There is no distension.     Palpations: Abdomen is soft.     Tenderness:  There is no abdominal tenderness. There is no guarding or rebound.  Musculoskeletal:        General: Normal range of motion.     Cervical back: Full passive range of motion without pain, normal range of motion and neck supple.     Comments: No midline C/T/L spinal tenderness to palpation, no deformity, crepitus, or step-off noted.  Pelvis stable to compression without pain.  Left hip deformity noted without skin break, tender to palpation.  Left leg shortened and internally rotated.  All other major joints palpated brought through appropriate range of motion for age without pain.  Feet:     Right foot:     Protective Sensation: 3 sites tested. 3 sites sensed.     Left foot:     Protective Sensation: 3 sites tested. 3 sites sensed.  Skin:    General: Skin is warm and dry.  Neurological:     Mental Status: He is alert.     GCS: GCS eye subscore is 4. GCS verbal subscore is 5. GCS motor subscore is 6.     Comments: Speech is clear and goal oriented, follows commands Major Cranial nerves without deficit, no facial droop Moves extremities without ataxia, coordination intact  Psychiatric:        Behavior: Behavior normal.     ED Results / Procedures / Treatments   Labs (all labs ordered are listed, but only abnormal results are displayed) Labs Reviewed  CBC WITH DIFFERENTIAL/PLATELET - Abnormal; Notable for the following components:      Result Value   RBC 3.29 (*)    Hemoglobin 10.8 (*)    HCT 34.3 (*)    MCV 104.3 (*)    Neutro Abs 8.2 (*)    All other components within normal limits  BASIC METABOLIC PANEL - Abnormal; Notable for the following components:   Glucose, Bld 111 (*)    BUN 28 (*)    Creatinine, Ser 1.57 (*)    GFR calc non Af Amer 39 (*)    GFR calc Af Amer 45 (*)    All other components within normal limits  RESPIRATORY PANEL BY RT PCR (FLU A&B, COVID)    EKG None  Radiology DG Chest Portable 1 View  Result Date: 12/01/2019 CLINICAL DATA:  Pain status post  fall EXAM: PORTABLE CHEST 1 VIEW COMPARISON:  March 12, 2018 FINDINGS: The heart is enlarged. The  lung volumes are low. There is persistent elevation of the right hemidiaphragm, similar to prior study. There is no pneumothorax. No significant pleural effusion. There are advanced degenerative changes of both glenohumeral joints, right worse than left. IMPRESSION: 1. No acute cardiopulmonary process. 2. Persistent elevation of the right hemidiaphragm. 3. Cardiomegaly without edema or pleural effusion. Electronically Signed   By: Constance Holster M.D.   On: 12/01/2019 18:01   DG Hip Port Unilat W or Wo Pelvis 1 View Left  Result Date: 12/01/2019 CLINICAL DATA:  Status post reduction EXAM: DG HIP (WITH OR WITHOUT PELVIS) 1V PORT LEFT COMPARISON:  X-ray earlier in the same day FINDINGS: There is improved osseous alignment status post reduction of the left hip dislocation. There is no evidence for a fracture. Again noted is a left hip prosthesis. Again noted are moderate degenerative changes of the right hip. IMPRESSION: Improved alignment status post reduction of the left hip dislocation. Electronically Signed   By: Constance Holster M.D.   On: 12/01/2019 20:21   DG Hip Unilat W or Wo Pelvis 2-3 Views Left  Result Date: 12/01/2019 CLINICAL DATA:  Pain status post fall EXAM: DG HIP (WITH OR WITHOUT PELVIS) 2-3V LEFT COMPARISON:  Dec 18, 2011 FINDINGS: The patient has undergone prior total hip arthroplasty on the left. There is a posterosuperior dislocation of the left hip. There are moderate degenerative changes of the right hip. There is no acute displaced fracture. IMPRESSION: Posterosuperior dislocation of the left hip. Electronically Signed   By: Constance Holster M.D.   On: 12/01/2019 18:00    Procedures .Ortho Injury Treatment  Date/Time: 12/01/2019 7:42 PM Performed by: Deliah Boston, PA-C Authorized by: Deliah Boston, PA-C   Consent:    Consent obtained:  Verbal   Consent given by:   Patient   Risks discussed:  Fracture, irreducible dislocation, recurrent dislocation, nerve damage, restricted joint movement, stiffness and vascular damageInjury location: hip Location details: left hip Injury type: dislocation Dislocation type: posterior Prosthesis: yes Pre-procedure neurovascular assessment: neurovascularly intact Pre-procedure distal perfusion: normal Pre-procedure neurological function: normal Pre-procedure range of motion: reduced  Patient sedated: Yes. Refer to sedation procedure documentation for details of sedation. Manipulation performed: yes Reduction method: traction and counter traction Reduction successful: yes Immobilization: Knee immobilizer. Post-procedure neurovascular assessment: post-procedure neurovascularly intact Post-procedure distal perfusion: normal Post-procedure neurological function: normal Post-procedure range of motion: improved Patient tolerance: patient tolerated the procedure well with no immediate complications Comments: Sedation by Dr. Ronnald Nian    (including critical care time)  Medications Ordered in ED Medications  fentaNYL (SUBLIMAZE) injection 25 mcg (25 mcg Intravenous Not Given 12/01/19 2003)  propofol (DIPRIVAN) 10 mg/mL bolus/IV push 106.6 mg (106.6 mg Intravenous Not Given 12/01/19 2004)  sodium chloride 0.9 % bolus 1,000 mL (has no administration in time range)  ondansetron (ZOFRAN) injection 4 mg (4 mg Intravenous Not Given 12/01/19 2004)  propofol (DIPRIVAN) 10 mg/mL bolus/IV push (50 mg Intravenous Given 12/01/19 1929)    ED Course  I have reviewed the triage vital signs and the nursing notes.  Pertinent labs & imaging results that were available during my care of the patient were reviewed by me and considered in my medical decision making (see chart for details).  Clinical Course as of Nov 30 2209  Sun Dec 01, 2019  1843 Dr. Mayer Camel, Monday or Wednesday follow-up   [BM]    Clinical Course User Index [BM] Gari Crown   MDM Rules/Calculators/A&P  86 year old male presents today after mechanical fall onto left hip.  He has shortening and internal rotation of the left hip, neurovascularly intact.  No head injury, loss of consciousness, blood thinner use, neck pain, back pain, chest pain, abdominal pain or pain to the other 3 extremities.  DG pelvis left hip:  IMPRESSION:  Posterosuperior dislocation of the left hip.  I personally reviewed patient's x-ray and agree with radiologist interpretation.  CXR:  IMPRESSION:  1. No acute cardiopulmonary process.  2. Persistent elevation of the right hemidiaphragm.  3. Cardiomegaly without edema or pleural effusion.  I have personally reviewed patient's x-ray and agree with radiologist interpretation. ------- Basic blood work, CBC, BMP and Covid test was ordered in case patient would require admission.  I personally reviewed and interpreted the following labs.  Covid/influenza panel negative.  CBC shows baseline anemia of 10.8, no leukocytosis suggest infection.  BMP shows baseline kidney function, no AKI or emergent electrolyte derangement. - Patient's left hip was successfully reduced, please see reduction note above and Dr. Sanjuana Kava sedation note.  Neurovascular intact post procedure.  Patient was placed in knee immobilizer and given a walker which he uses at baseline at home.  Patient was monitored for some time, his son arrived.  Patient is now ambulating around the emergency department using the walker, he is requesting discharge, patient and his son are both comfortable with discharge to home with walker they have no unaddressed concerns.  They will follow up with patient's orthopedic surgeon this week.  Consult was called to orthopedist Dr. Mayer Camel who advises outpatient follow-up on Monday or Wednesday with Dr. Rhona Raider.  At this time there does not appear to be any evidence of an acute emergency medical condition and the  patient appears stable for discharge with appropriate outpatient follow up. Diagnosis was discussed with patient who verbalizes understanding of care plan and is agreeable to discharge. I have discussed return precautions with patient who verbalizes understanding. Patient encouraged to follow-up with their PCP and ortho. All questions answered.  Patient's case discussed with Dr. Ronnald Nian who agrees with plan to discharge with follow-up.   Note: Portions of this report may have been transcribed using voice recognition software. Every effort was made to ensure accuracy; however, inadvertent computerized transcription errors may still be present. Final Clinical Impression(s) / ED Diagnoses Final diagnoses:  Fall, initial encounter  Closed dislocation of left hip, initial encounter [facility])    Rx / DC Orders ED Discharge Orders    None       Gari Crown 12/01/19 2211    Lennice Sites, DO 12/01/19 2319

## 2019-12-05 DIAGNOSIS — N184 Chronic kidney disease, stage 4 (severe): Secondary | ICD-10-CM | POA: Diagnosis not present

## 2019-12-09 DIAGNOSIS — N2581 Secondary hyperparathyroidism of renal origin: Secondary | ICD-10-CM | POA: Diagnosis not present

## 2019-12-09 DIAGNOSIS — N1832 Chronic kidney disease, stage 3b: Secondary | ICD-10-CM | POA: Diagnosis not present

## 2019-12-09 DIAGNOSIS — I129 Hypertensive chronic kidney disease with stage 1 through stage 4 chronic kidney disease, or unspecified chronic kidney disease: Secondary | ICD-10-CM | POA: Diagnosis not present

## 2019-12-09 DIAGNOSIS — D631 Anemia in chronic kidney disease: Secondary | ICD-10-CM | POA: Diagnosis not present

## 2020-03-11 DIAGNOSIS — I10 Essential (primary) hypertension: Secondary | ICD-10-CM | POA: Diagnosis not present

## 2020-03-11 DIAGNOSIS — K219 Gastro-esophageal reflux disease without esophagitis: Secondary | ICD-10-CM | POA: Diagnosis not present

## 2020-03-11 DIAGNOSIS — E669 Obesity, unspecified: Secondary | ICD-10-CM | POA: Diagnosis not present

## 2020-03-11 DIAGNOSIS — D631 Anemia in chronic kidney disease: Secondary | ICD-10-CM | POA: Diagnosis not present

## 2020-03-11 DIAGNOSIS — M170 Bilateral primary osteoarthritis of knee: Secondary | ICD-10-CM | POA: Diagnosis not present

## 2020-03-20 IMAGING — DX DG CHEST 2V
3 series · 3 of 3 positions shown · non-contrast
Comparison: Chest x-ray dated October 03, 2012.

CLINICAL DATA: Dizziness and weakness.

EXAM:
CHEST - 2 VIEW

[chest lat (1 of 2)]
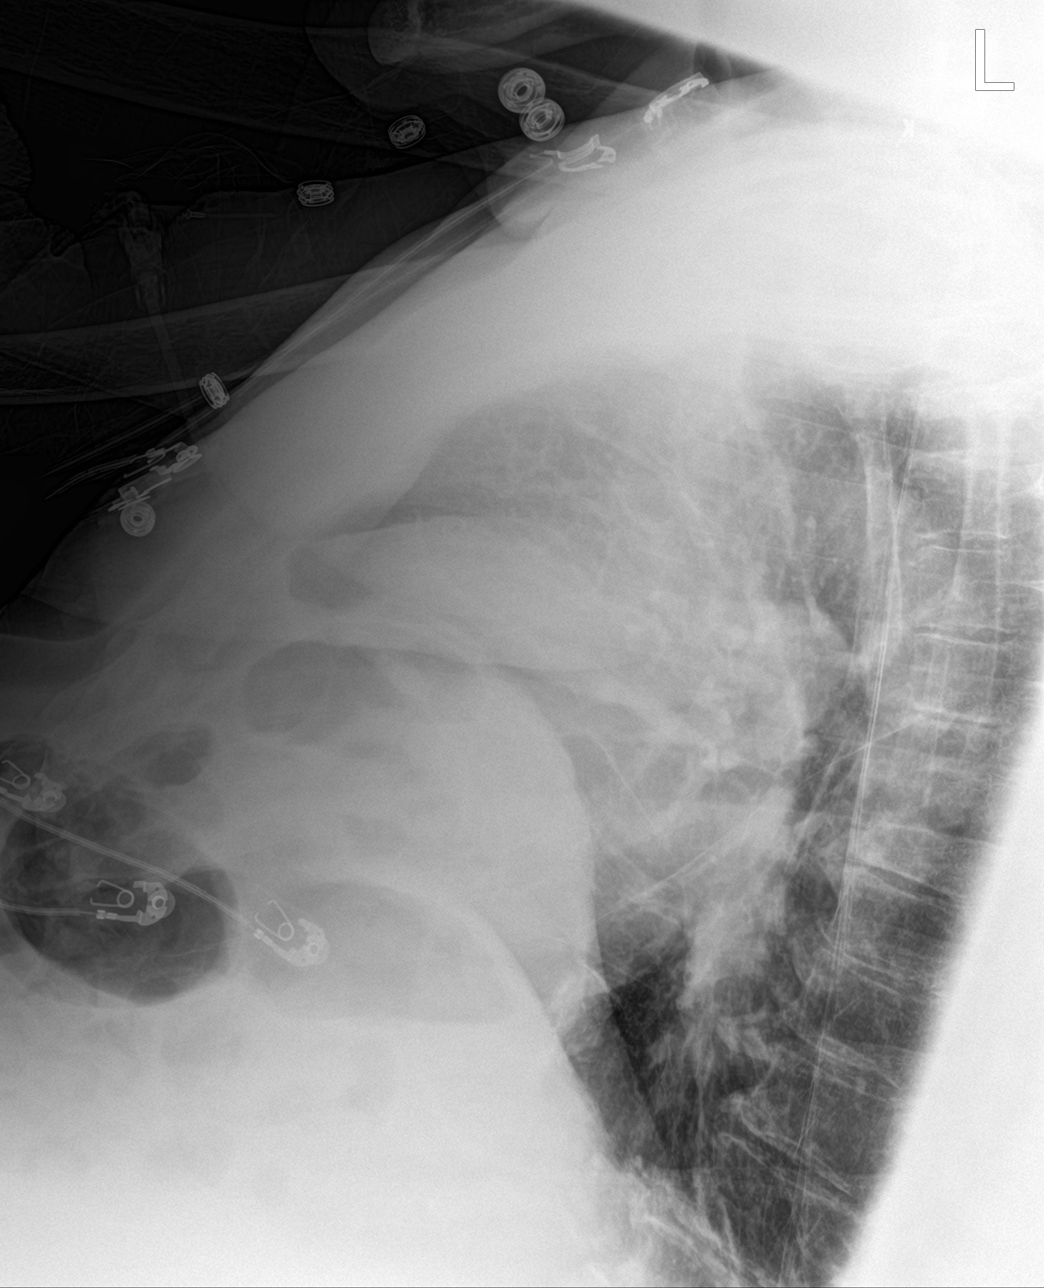

[chest pa]
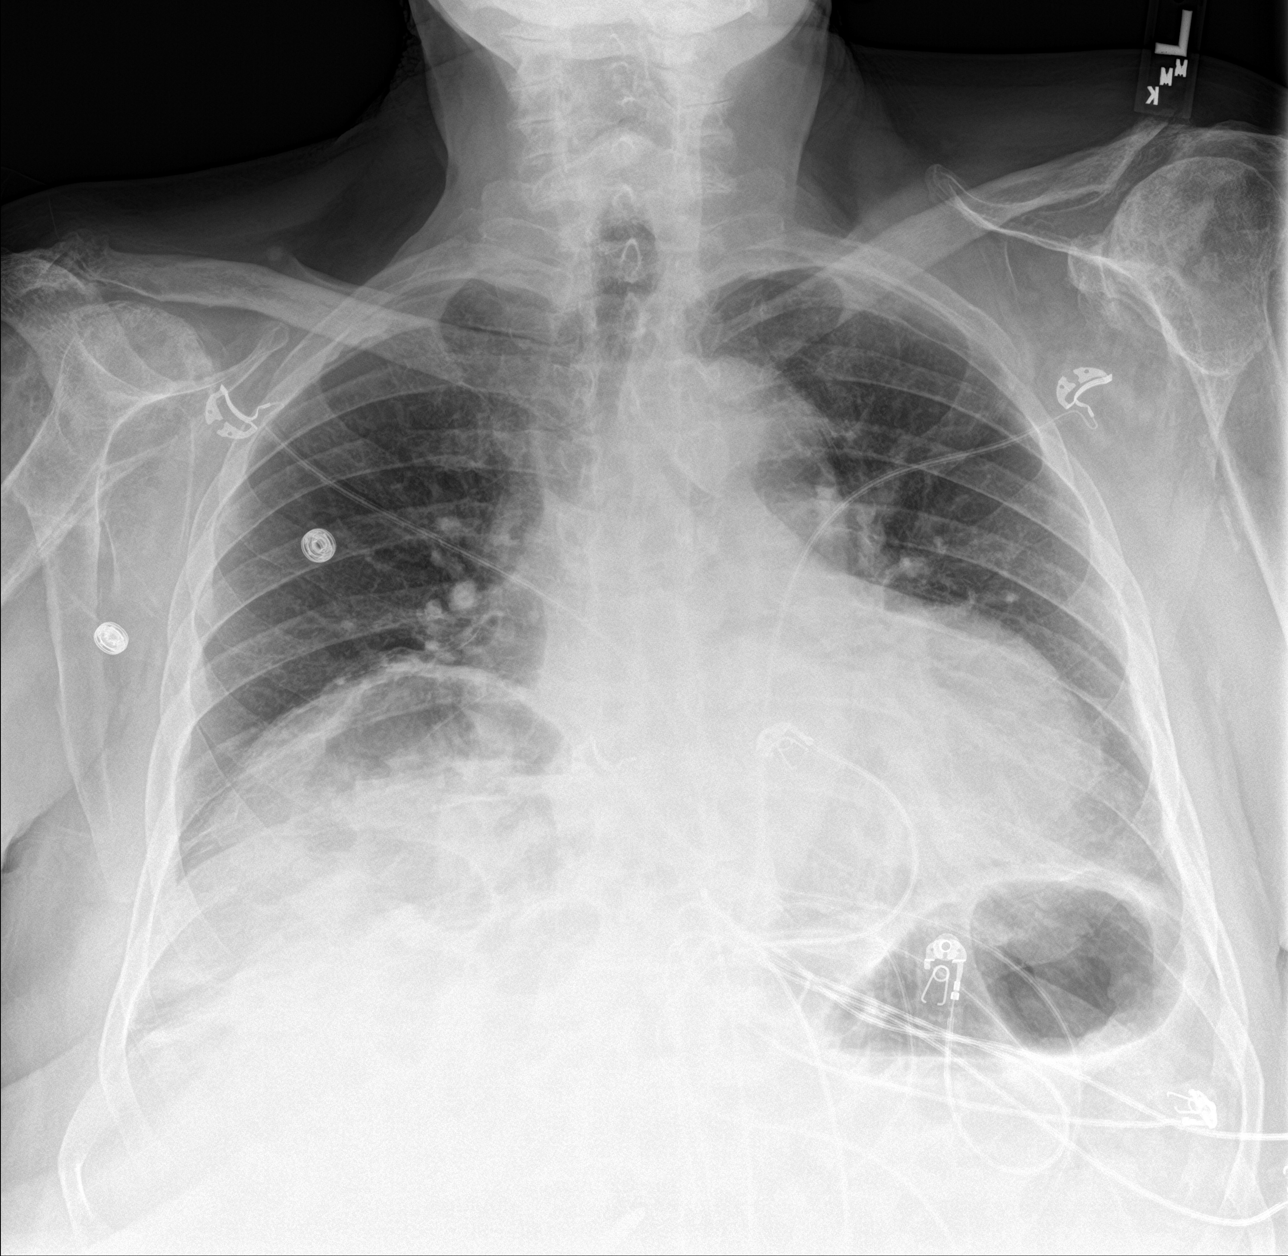

[chest lat (2 of 2)]
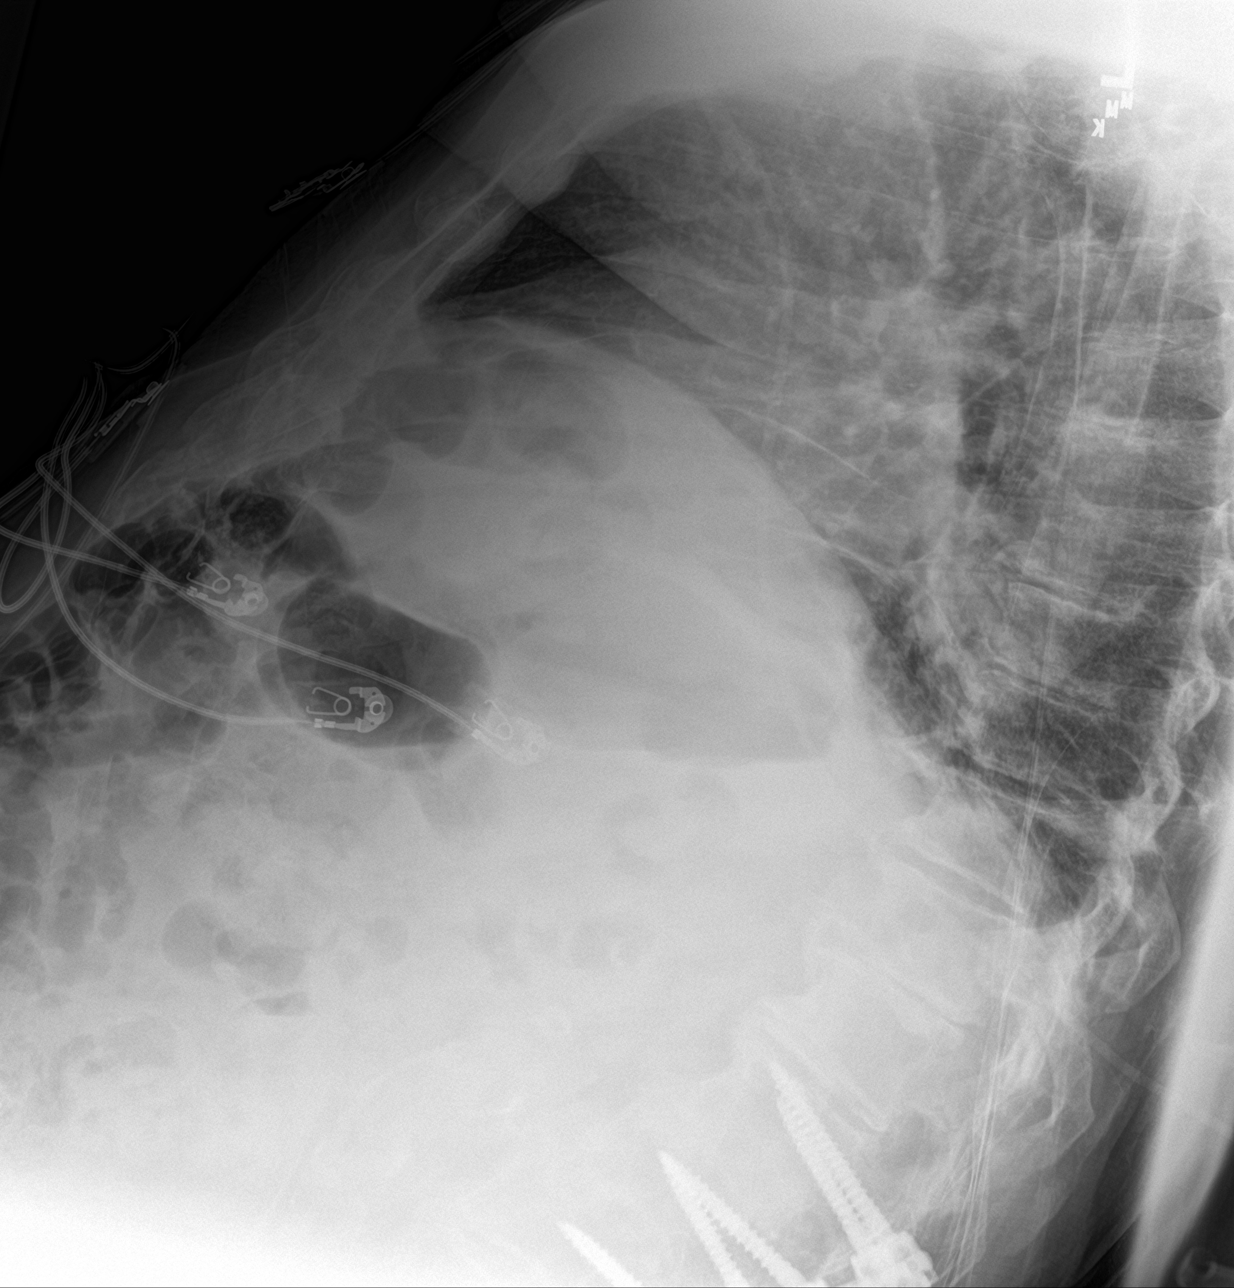

[3 of 3 positions shown; findings below may reference images not displayed]

FINDINGS: Moderate cardiomegaly, progressed when compared to prior study.
Normal pulmonary vascularity. Low lung volumes are present, causing
crowding of the pulmonary vasculature. Unchanged elevation of the
right hemidiaphragm and interposition of the colon. Unchanged
bibasilar scarring. No focal consolidation, pleural effusion, or
pneumothorax. No acute osseous abnormality. Prior lumbar fusion.
Severe degenerative changes of the bilateral shoulders.
IMPRESSION: 1. No active cardiopulmonary disease.
2. Progressed moderate cardiomegaly.  Unchanged bibasilar scarring.

## 2020-07-26 DIAGNOSIS — I499 Cardiac arrhythmia, unspecified: Secondary | ICD-10-CM | POA: Diagnosis not present

## 2020-07-26 DIAGNOSIS — I469 Cardiac arrest, cause unspecified: Secondary | ICD-10-CM | POA: Diagnosis not present

## 2020-07-26 DIAGNOSIS — Z743 Need for continuous supervision: Secondary | ICD-10-CM | POA: Diagnosis not present

## 2020-07-26 DIAGNOSIS — R404 Transient alteration of awareness: Secondary | ICD-10-CM | POA: Diagnosis not present

## 2020-08-01 DEATH — deceased
